# Patient Record
Sex: Male | Born: 1960 | Race: White | Hispanic: No | Marital: Married | Smoking: Current every day smoker
Health system: Southern US, Community
[De-identification: ages and names within clinical notes are randomized; demographics above are authoritative.]

## PROBLEM LIST (undated history)

## (undated) DIAGNOSIS — I10 Essential (primary) hypertension: Secondary | ICD-10-CM

## (undated) DIAGNOSIS — E119 Type 2 diabetes mellitus without complications: Secondary | ICD-10-CM

## (undated) DIAGNOSIS — F419 Anxiety disorder, unspecified: Secondary | ICD-10-CM

## (undated) DIAGNOSIS — Z9889 Other specified postprocedural states: Secondary | ICD-10-CM

## (undated) DIAGNOSIS — M1990 Unspecified osteoarthritis, unspecified site: Secondary | ICD-10-CM

## (undated) DIAGNOSIS — E785 Hyperlipidemia, unspecified: Secondary | ICD-10-CM

## (undated) DIAGNOSIS — I8290 Acute embolism and thrombosis of unspecified vein: Secondary | ICD-10-CM

## (undated) DIAGNOSIS — R112 Nausea with vomiting, unspecified: Secondary | ICD-10-CM

## (undated) DIAGNOSIS — I724 Aneurysm of artery of lower extremity: Secondary | ICD-10-CM

## (undated) HISTORY — DX: Acute embolism and thrombosis of unspecified vein: I82.90

## (undated) HISTORY — PX: INNER EAR SURGERY: SHX679

## (undated) HISTORY — DX: Type 2 diabetes mellitus without complications: E11.9

---

## 1994-12-10 HISTORY — PX: KNEE SURGERY: SHX244

## 2005-08-03 ENCOUNTER — Ambulatory Visit: Payer: Self-pay | Admitting: Internal Medicine

## 2006-05-14 ENCOUNTER — Ambulatory Visit: Payer: Self-pay | Admitting: Unknown Physician Specialty

## 2007-10-14 ENCOUNTER — Ambulatory Visit: Payer: Self-pay | Admitting: Unknown Physician Specialty

## 2007-10-14 ENCOUNTER — Other Ambulatory Visit: Payer: Self-pay

## 2007-10-21 ENCOUNTER — Ambulatory Visit: Payer: Self-pay | Admitting: Unknown Physician Specialty

## 2009-05-19 ENCOUNTER — Ambulatory Visit: Payer: Self-pay | Admitting: Internal Medicine

## 2014-10-13 ENCOUNTER — Emergency Department (HOSPITAL_COMMUNITY): Payer: 59

## 2014-10-13 ENCOUNTER — Encounter (HOSPITAL_COMMUNITY): Payer: Self-pay | Admitting: *Deleted

## 2014-10-13 ENCOUNTER — Emergency Department (HOSPITAL_COMMUNITY)
Admission: EM | Admit: 2014-10-13 | Discharge: 2014-10-13 | Disposition: A | Discharge status: Home / Self Care | Payer: 59 | Attending: Emergency Medicine | Admitting: Emergency Medicine

## 2014-10-13 DIAGNOSIS — Z720 Tobacco use: Secondary | ICD-10-CM | POA: Diagnosis not present

## 2014-10-13 DIAGNOSIS — M25461 Effusion, right knee: Secondary | ICD-10-CM | POA: Insufficient documentation

## 2014-10-13 DIAGNOSIS — I10 Essential (primary) hypertension: Secondary | ICD-10-CM | POA: Diagnosis not present

## 2014-10-13 DIAGNOSIS — R609 Edema, unspecified: Secondary | ICD-10-CM

## 2014-10-13 DIAGNOSIS — M25561 Pain in right knee: Secondary | ICD-10-CM | POA: Diagnosis present

## 2014-10-13 HISTORY — DX: Essential (primary) hypertension: I10

## 2014-10-13 MED ORDER — HYDROCODONE-ACETAMINOPHEN 5-325 MG PO TABS: 1.0000 | ORAL_TABLET | ORAL | Status: DC | PRN

## 2014-10-13 MED ORDER — NAPROXEN 250 MG PO TABS
500.0000 mg | ORAL_TABLET | Freq: Once | ORAL | Status: AC
  Administered 2014-10-13: 500 mg via ORAL
  Filled 2014-10-13: qty 2

## 2014-10-13 MED ORDER — NAPROXEN 500 MG PO TABS: 500.0000 mg | ORAL_TABLET | Freq: Two times a day (BID) | ORAL | Status: DC

## 2014-10-13 NOTE — Discharge Instructions (Signed)
Please follow the directions provided. Be sure to call the Ortho referral to establish care for your knee effusion. Please take Naprosyn twice a day to help with inflammation and pain. You may take Vicodin for pain not relieved by the Naprosyn. Don't hesitate to return for any new, worsening, or concerning symptoms.   SEEK MEDICAL CARE IF:  There is increased swelling in your knee.  You notice redness, swelling, or increasing pain in your knee.  An unexplained oral temperature above 102 F (38.9 C) develops. SEEK IMMEDIATE MEDICAL CARE IF:  You develop a rash.  You have difficulty breathing.  You have any allergic reactions from medications you may have been given.  There is severe pain with any motion of the knee.

## 2014-10-13 NOTE — ED Notes (Signed)
Pt reports hx of knee surgery in past and having pain and swelling to right knee since yesterday. Ambulatory at triage.

## 2014-10-13 NOTE — ED Provider Notes (Signed)
CSN: [id number]     Arrival date & time 10/13/14  0917 History  This chart was scribed for non-physician practitioner, Harle BattiestElizabeth Ethal Gotay, PA-C,working with Purvis SheffieldForrest Harrison, MD, by Karle PlumberJennifer Tensley, ED Scribe. This patient was seen in room TR06C/TR06C and the patient's care was started at 10:18 AM.  Chief Complaint  Patient presents with  . Knee Pain   Patient is a 53 y.o. male presenting with knee pain. The history is provided by the patient. No language interpreter was used.  Knee Pain Associated symptoms: no fever     HPI Comments:  Estell HarpinKeith A Ormond is a 53 y.o. male with PMH of HTN and DM who presents to the Emergency Department complaining of severe throbbing right knee pain that began two days ago while sleeping. He states the pain woke him from his sleep. He reports the pain radiates down to his foot. Bearing weight makes the pain worse. He has been wearing an old knee brace he has had for a while with minimal relief of the pain. Denies any trauma, fall or injury. Denies fever, chills, numbness, tingling or weakness of the right leg, red streaking or warmth. Reports h/o right knee surgery approximately 18 years ago. He reports that he no longer has an orthopedist.   Past Medical History  Diagnosis Date  . Hypertension    Past Surgical History  Procedure Laterality Date  . Knee surgery     History reviewed. No pertinent family history. History  Substance Use Topics  . Smoking status: Current Every Day Smoker    Types: Cigarettes  . Smokeless tobacco: Not on file  . Alcohol Use: No    Review of Systems  Constitutional: Negative for fever and chills.  Gastrointestinal: Negative for nausea.  Musculoskeletal: Positive for joint swelling and arthralgias.  Skin: Negative for rash.    Allergies  Review of patient's allergies indicates no known allergies.  Home Medications   Prior to Admission medications   Not on File   Triage Vitals: BP 140/85 mmHg  Pulse 88  Temp(Src)  97.7 F (36.5 C) (Oral)  Ht 5\' 9"  (1.753 m)  Wt 210 lb (95.255 kg)  BMI 31.00 kg/m2  SpO2 96% Physical Exam  Constitutional: He is oriented to person, place, and time. He appears well-developed and well-nourished.  HENT:  Head: Normocephalic and atraumatic.  Eyes: EOM are normal.  Neck: Normal range of motion.  Cardiovascular: Normal rate.   Pulmonary/Chest: Effort normal.  Musculoskeletal: Normal range of motion. He exhibits edema and tenderness.  Unilateral swelling around right patella, supra and medial patella more significant. ROM limited due to pain.  Neurological: He is alert and oriented to person, place, and time.  Skin: Skin is warm and dry.  Psychiatric: He has a normal mood and affect. His behavior is normal.  Nursing note and vitals reviewed.   ED Course  Procedures (including critical care time) DIAGNOSTIC STUDIES: Oxygen Saturation is 96% on RA, adequate by my interpretation.   COORDINATION OF CARE: 10:24 AM- Will prescribe pain medication and NSAID. Will give orthopedist referral. Pt verbalizes understanding and agrees to plan.  Medications  naproxen (NAPROSYN) tablet 500 mg (500 mg Oral Given 10/13/14 1115)    Labs Review Labs Reviewed - No data to display  Imaging Review Dg Knee Complete 4 Views Right  10/13/2014   CLINICAL DATA:  53 year old male with acute medial knee pain and swelling. No known injury. Initial encounter.  EXAM: RIGHT KNEE - COMPLETE 4+ VIEW  COMPARISON:  None.  FINDINGS: Moderate to large suprapatellar joint effusion. Advanced tricompartmental degenerative spurring. Joint space loss most pronounced in the medial compartment with subchondral sclerosis. Patella intact. No acute fracture or dislocation identified.  IMPRESSION: Advanced degenerative changes, medial compartment dominant. Joint effusion. No acute osseous abnormality identified.   Electronically Signed   By: Augusto GambleLee  Hall M.D.   On: 10/13/2014 11:04     EKG Interpretation None       MDM   Final diagnoses:  Swelling  Knee effusion, right   53 yo male presenting with knee pain and swelling without history of injury. His x-ray is negative for obvious fracture or dislocation, but shows joint effusion. He is afebrile, without signs or symptoms of joint infection. Pain managed in ED. Knee sleeve provided and orthopedic referral provided for drainage of effusion. Conservative therapy recommended and discussed. Patient will be dc home & is agreeable with above plan. Return precautions provided.  I personally performed the services described in this documentation, which was scribed in my presence. The recorded information has been reviewed and is accurate.  Filed Vitals:   10/13/14 0926 10/13/14 1216  BP: 140/85 152/96  Pulse: 88 74  Temp: 97.7 F (36.5 C) 98.6 F (37 C)  TempSrc: Oral Oral  Resp:  18  Height: 5\' 9"  (1.753 m)   Weight: 210 lb (95.255 kg)   SpO2: 96% 95%   Meds given in ED:  Medications  naproxen (NAPROSYN) tablet 500 mg (500 mg Oral Given 10/13/14 1115)    Discharge Medication List as of 10/13/2014 12:15 PM    START taking these medications   Details  HYDROcodone-acetaminophen (NORCO/VICODIN) 5-325 MG per tablet Take 1-2 tablets by mouth every 4 (four) hours as needed for moderate pain or severe pain., Starting 10/13/2014, Until Discontinued, Print    naproxen (NAPROSYN) 500 MG tablet Take 1 tablet (500 mg total) by mouth 2 (two) times daily with a meal., Starting 10/13/2014, Until Discontinued, Print         Harle BattiestElizabeth Sabrin Dunlevy, NP 10/15/14 1659  Purvis SheffieldForrest Harrison, MD 10/16/14 1017

## 2014-10-13 NOTE — ED Notes (Signed)
Pts vital signs updated pt awaiting discharge paperwork at bedside.  

## 2014-10-13 NOTE — ED Notes (Signed)
Applied knee sleeve. Pt verbalized understanding and demonstrated application of knee sleeve

## 2015-09-23 ENCOUNTER — Other Ambulatory Visit: Payer: Self-pay | Admitting: Unknown Physician Specialty

## 2015-09-23 ENCOUNTER — Ambulatory Visit
Admission: RE | Admit: 2015-09-23 | Discharge: 2015-09-23 | Disposition: A | Discharge status: Home / Self Care | Payer: 59 | Source: Ambulatory Visit | Attending: Unknown Physician Specialty | Admitting: Unknown Physician Specialty

## 2015-09-23 DIAGNOSIS — M25562 Pain in left knee: Secondary | ICD-10-CM

## 2015-09-23 DIAGNOSIS — I743 Embolism and thrombosis of arteries of the lower extremities: Secondary | ICD-10-CM | POA: Diagnosis not present

## 2015-09-23 DIAGNOSIS — I724 Aneurysm of artery of lower extremity: Secondary | ICD-10-CM | POA: Diagnosis not present

## 2015-09-28 ENCOUNTER — Emergency Department (HOSPITAL_COMMUNITY)
Admission: EM | Admit: 2015-09-28 | Discharge: 2015-09-28 | Disposition: A | Discharge status: Home / Self Care | Payer: 59 | Attending: Emergency Medicine | Admitting: Emergency Medicine

## 2015-09-28 ENCOUNTER — Encounter (HOSPITAL_COMMUNITY): Payer: Self-pay

## 2015-09-28 DIAGNOSIS — I724 Aneurysm of artery of lower extremity: Secondary | ICD-10-CM | POA: Diagnosis not present

## 2015-09-28 DIAGNOSIS — I10 Essential (primary) hypertension: Secondary | ICD-10-CM | POA: Diagnosis not present

## 2015-09-28 DIAGNOSIS — Z720 Tobacco use: Secondary | ICD-10-CM | POA: Diagnosis not present

## 2015-09-28 DIAGNOSIS — M25562 Pain in left knee: Secondary | ICD-10-CM | POA: Insufficient documentation

## 2015-09-28 MED ORDER — HYDROCODONE-ACETAMINOPHEN 5-325 MG PO TABS: 1.0000 | ORAL_TABLET | Freq: Four times a day (QID) | ORAL | Status: DC | PRN

## 2015-09-28 NOTE — ED Provider Notes (Signed)
CSN: [id number]     Arrival date & time 09/28/15  1259 History   First MD Initiated Contact with Patient 09/28/15 1315     Chief Complaint  Patient presents with  . Leg Pain     (Consider location/radiation/quality/duration/timing/severity/associated sxs/prior Treatment) HPI Comments: Estell HarpinKeith A Skibinski is a 54 y.o. male with a PMHx of HTN, who presents to the ED with complaints of ongoing left posterior knee pain 5 days. 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 Imogene Burnhen with vascular surgery next Wednesday. He states that he was out of work for the last 5 days, went back to work today and had pain when he was standing and walking around, therefore his boss prompted him to seek medical attention in the ER to see if there is any thing else that needed to be done before his appointment with Dr. Imogene Burnhen next week.   He reports the pain is 10/10 with walking but 0/10 at rest, located in the posterior aspect of his left knee, radiating to the posterior calf, intermittent with walking, burning in nature, worse with walking, relieved with rest, and unchanged with etodolac. He reports that he gets mild swelling in the leg when he stands or walks, improves with elevation, and is currently resolved.  He denies any fevers, chills, chest pain, shortness breath, abdominal pain, nausea, vomiting, diarrhea, constipation, dysuria, hematuria, numbness, tingling, or focal weakness.  Patient is a 54 y.o. male presenting with leg pain. The history is provided by the patient and medical records. No language interpreter was used.  Leg Pain Location:  Knee Time since incident:  5 days Injury: no   Knee location:  L knee Pain details:    Quality:  Burning   Radiates to:  L leg   Severity:  Severe   Onset quality:  Gradual   Duration:  5 days   Timing:  Intermittent   Progression:  Waxing and waning Chronicity:   New Prior injury to area:  No Relieved by:  Rest Worsened by:  Activity Ineffective treatments:  Arthritis medication Associated symptoms: swelling (with standing, improves with elevation, resolved at this time)   Associated symptoms: no decreased ROM, no fever, no muscle weakness, no numbness and no tingling     Past Medical History  Diagnosis Date  . Hypertension    Past Surgical History  Procedure Laterality Date  . Knee surgery     No family history on file. Social History  Substance Use Topics  . Smoking status: Current Every Day Smoker -- 0.50 packs/day    Types: Cigarettes  . Smokeless tobacco: None  . Alcohol Use: No    Review of Systems  Constitutional: Negative for fever and chills.  Respiratory: Negative for shortness of breath.   Cardiovascular: Positive for leg swelling (intermittently with walking/standing, improves with elevation, currently resolved). Negative for chest pain.  Gastrointestinal: Negative for nausea, vomiting, abdominal pain, diarrhea and constipation.  Genitourinary: Negative for dysuria and hematuria.  Musculoskeletal: Positive for arthralgias (L knee posteriorly). Negative for myalgias.  Skin: Negative for color change.  Allergic/Immunologic: Negative for immunocompromised state.  Neurological: Negative for weakness and numbness.  Psychiatric/Behavioral: Negative for confusion.   10 Systems reviewed and are negative for acute change except as noted in the HPI.    Allergies  Review of patient's allergies indicates no known allergies.  Home Medications   Prior to  Admission medications   Medication Sig Start Date End Date Taking? Authorizing Provider  HYDROcodone-acetaminophen (NORCO/VICODIN) 5-325 MG per tablet Take 1-2 tablets by mouth every 4 (four) hours as needed for moderate pain or severe pain. 10/13/14   Harle Battiest, NP  naproxen (NAPROSYN) 500 MG tablet Take 1 tablet (500 mg total) by mouth 2 (two) times daily with a meal.  10/13/14   Harle Battiest, NP   BP 141/89 mmHg  Pulse 78  Temp(Src) 98.2 F (36.8 C) (Oral)  Resp 14  Ht  (1.753 m)  Wt 232 lb (105.235 kg)  BMI 34.24 kg/m2  SpO2 96% Physical Exam  Constitutional: He is oriented to person, place, and time. Vital signs are normal. He appears well-developed and well-nourished.  Non-toxic appearance. No distress.  Afebrile, nontoxic, NAD  HENT:  Head: Normocephalic and atraumatic.  Mouth/Throat: Oropharynx is clear and moist and mucous membranes are normal.  Eyes: Conjunctivae and EOM are normal. Right eye exhibits no discharge. Left eye exhibits no discharge.  Neck: Normal range of motion. Neck supple.  Cardiovascular: Normal rate, regular rhythm, normal heart sounds and intact distal pulses.  Exam reveals no gallop and no friction rub.   No murmur heard. Pulmonary/Chest: Effort normal and breath sounds normal. No respiratory distress. He has no decreased breath sounds. He has no wheezes. He has no rhonchi. He has no rales.  Abdominal: Soft. Normal appearance and bowel sounds are normal. He exhibits no distension. There is no tenderness. There is no rigidity, no rebound, no guarding, no CVA tenderness, no tenderness at McBurney's point and negative Murphy's sign.  Musculoskeletal: Normal range of motion.       Left knee: He exhibits normal range of motion, no swelling, no effusion, no erythema, normal alignment, no LCL laxity, normal patellar mobility and no MCL laxity. No tenderness found.  MAE x4 Strength and sensation grossly intact Distal pulses intact in all extremities L knee with FROM intact, no joint line or bony TTP, no swelling/effusion/deformity, no bruising or erythema/warmth, no abnormal alignment or patellar mobility, no varus/valgus laxity, neg anterior drawer test, no crepitus. No focal areas of tenderness, no baker's cyst. No palpable masses posteriorly.  No pedal edema, neg homan's bilaterally   Neurological: He is alert and  oriented to person, place, and time. He has normal strength. No sensory deficit.  Skin: Skin is warm, dry and intact. No rash noted.  Psychiatric: He has a normal mood and affect.  Nursing note and vitals reviewed.   ED Course  Procedures (including critical care time) Labs Review Labs Reviewed - No data to display  Imaging Review No results found.    Study Result: LLE venous doppler u/s    CLINICAL DATA: Left posterior knee pain for 1 day.  EXAM: LEFT LOWER EXTREMITY VENOUS DOPPLER ULTRASOUND  TECHNIQUE: Gray-scale sonography with graded compression, as well as color Doppler and duplex ultrasound, were performed to evaluate the deep venous system from the level of the common femoral vein through the popliteal and proximal calf veins. Spectral Doppler was utilized to evaluate flow at rest and with distal augmentation maneuvers.  COMPARISON: None.  FINDINGS: Normal compressibility, augmentation and color Doppler flow in the left common femoral vein, left femoral vein and left popliteal vein. The left saphenofemoral junction is patent. Visualized left deep calf veins are patent.  The right common femoral vein is compressible without thrombus.  There is enlargement of the left popliteal artery measuring up to 2.1 cm in diameter. A small  amount of mural thrombus associated with this aneurysm. Aneurysm segment measures roughly 3.4 cm in length.  IMPRESSION: Negative for deep vein thrombosis in the left lower extremity.  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 Small amount of mural thrombus within the aneurysm. Recommend vascular surgery consultation for management of this aneurysm.   Electronically Signed  By: Richarda Overlie M.D.  On: 09/23/2015 17:19     I have personally reviewed and evaluated these images and lab results as part of my medical decision-making.   EKG Interpretation   Date/Time:  Wednesday September 28 2015 13:02:00  EDT Ventricular Rate:  81 PR Interval:  158 QRS Duration: 92 QT Interval:  353 QTC Calculation: 410 R Axis:   18 Text Interpretation:  Sinus rhythm Abnormal R-wave progression, early  transition Otherwise within normal limits No significant change since last  tracing Confirmed by POLLINA  MD, CHRISTOPHER [phone]) on 09/28/2015  1:09:25 PM      MDM   Final diagnoses:  Knee pain, acute, left  Popliteal artery aneurysm [facility])    54 y.o. 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 Imogene Burn in 1wk for this. Went back to work today and had worsening pain due to having to walk more, and his boss asked that he be seen in the ER. On exam, leg with no edema, no focal area of tenderness, no skin changes, distal pulses intact, leg NVI with soft compartments. Will call Dr. Imogene Burn to discuss if any further imaging could assist them for his upcoming appt, or if any other concerns should be addressed today. Will reassess shortly.   1:59 PM Spoke with Dr. Imogene Burn, he reports nothing further to do since pt has perfusion to foot. Will d/c home with additional pain medication, discussed elevation and rest to help with pain, and will give work note. F/up with Dr. Imogene Burn next wk at already scheduled appt. I explained the diagnosis and have given explicit precautions to return to the ER including for any other new or worsening symptoms. The patient understands and accepts the medical plan as it's been dictated and I have answered their questions. Discharge instructions concerning home care and prescriptions have been given. The patient is STABLE and is discharged to home in good condition.  BP 141/89 mmHg  Pulse 78  Temp(Src) 98.2 F (36.8 C) (Oral)  Resp 14  Ht  (1.753 m)  Wt 232 lb (105.235 kg)  BMI 34.24 kg/m2  SpO2 96%  Meds ordered this encounter  Medications  . HYDROcodone-acetaminophen (NORCO)  5-325 MG tablet    Sig: Take 1 tablet by mouth every 6 (six) hours as needed for severe pain.    Dispense:  15 tablet    Refill:  0    Order Specific Question:  Supervising Provider    Answer:  Eber Hong [[id number]]     Tina Gruner Camprubi-Soms, PA-C 09/28/15 1400  Gilda Crease, MD 09/28/15 1404

## 2015-09-28 NOTE — ED Notes (Addendum)
Pt came to ED c/o leg pain. Pt reports pain when leg is bent or when he walks. Pt sts it gets tight and swollen when walking. Pt reports going to the doctors on Friday and they told him he had an aneurysm in leg. Pt reports the doctors gave him arthritis medication. Pt has previous history of blood clot.

## 2015-09-28 NOTE — Discharge Instructions (Signed)
Use your home pain medication as directed as needed for pain, and use norco as directed as needed for additional pain relief but don't drive while taking norco. Keep your leg elevated and rest it to avoid aggravating your symptoms. Use heat to the area of soreness as needed for additional relief. Follow up with Dr. Imogene Burnhen next week for your already scheduled visit. Return to the ER for changes or worsening in symptoms.   Knee Pain Knee pain is a common problem. It can have many causes. The pain often goes away by following your doctor's home care instructions. Treatment for ongoing pain will depend on the cause of your pain. If your knee pain continues, more tests may be needed to diagnose your condition. Tests may include X-rays or other imaging studies of your knee. HOME CARE  Take medicines only as told by your doctor.  Rest your knee and keep it raised (elevated) while you are resting.  Do not do things that cause pain or make your pain worse.  Avoid activities where both feet leave the ground at the same time, such as running, jumping rope, or doing jumping jacks.  Apply ice to the knee area:  Put ice in a plastic bag.  Place a towel between your skin and the bag.  Leave the ice on for 20 minutes, 2-3 times a day.  Ask your doctor if you should wear an elastic knee support.  Sleep with a pillow under your knee.  Lose weight if you are overweight. Being overweight can make your knee hurt more.  Do not use any tobacco products, including cigarettes, chewing tobacco, or electronic cigarettes. If you need help quitting, ask your doctor. Smoking may slow the healing of any bone and joint problems that you may have. GET HELP IF:  Your knee pain does not stop, it changes, or it gets worse.  You have a fever along with knee pain.  Your knee gives out or locks up.  Your knee becomes more swollen. GET HELP RIGHT AWAY IF:   Your knee feels hot to the touch.  You have chest pain or  trouble breathing.   This information is not intended to replace advice given to you by your health care provider. Make sure you discuss any questions you have with your health care provider.   Document Released: 02/22/2009 Document Revised: 12/17/2014 Document Reviewed: 01/27/2014 Elsevier Interactive Patient Education 2016 [employer]  [employer]Heat Therapy Heat therapy can help ease sore, stiff, injured, and tight muscles and joints. Heat relaxes your muscles, which may help ease your pain.  RISKS AND COMPLICATIONS If you have any of the following conditions, do not use heat therapy unless your health care provider has approved:  Poor circulation.  Healing wounds or scarred skin in the area being treated.  Diabetes, heart disease, or high blood pressure.  Not being able to feel (numbness) the area being treated.  Unusual swelling of the area being treated.  Active infections.  Blood clots.  Cancer.  Inability to communicate pain. This may include young children and people who have problems with their brain function (dementia).  Pregnancy. Heat therapy should only be used on old, pre-existing, or long-lasting (chronic) injuries. Do not use heat therapy on new injuries unless directed by your health care provider. HOW TO USE HEAT THERAPY There are several different kinds of heat therapy, including:  Moist heat pack.  Warm water bath.  Hot water bottle.  Electric heating pad.  Heated gel pack.  Heated wrap.  Electric heating pad. Use the heat therapy method suggested by your health care provider. Follow your health care provider's instructions on when and how to use heat therapy. GENERAL HEAT THERAPY RECOMMENDATIONS  Do not sleep while using heat therapy. Only use heat therapy while you are awake.  Your skin may turn pink while using heat therapy. Do not use heat therapy if your skin turns red.  Do not use heat therapy if you have new pain.  High heat or long  exposure to heat can cause burns. Be careful when using heat therapy to avoid burning your skin.  Do not use heat therapy on areas of your skin that are already irritated, such as with a rash or sunburn. SEEK MEDICAL CARE IF:  You have blisters, redness, swelling, or numbness.  You have new pain.  Your pain is worse. MAKE SURE YOU:  Understand these instructions.  Will watch your condition.  Will get help right away if you are not doing well or get worse.   This information is not intended to replace advice given to you by your health care provider. Make sure you discuss any questions you have with your health care provider.   Document Released: 02/18/2012 Document Revised: 12/17/2014 Document Reviewed: 01/19/2014 Elsevier Interactive Patient Education [employer]

## 2015-09-30 ENCOUNTER — Encounter: Payer: Self-pay | Admitting: Vascular Surgery

## 2015-10-05 ENCOUNTER — Encounter: Payer: Self-pay | Admitting: *Deleted

## 2015-10-05 ENCOUNTER — Encounter: Payer: Self-pay | Admitting: Vascular Surgery

## 2015-10-05 ENCOUNTER — Ambulatory Visit (INDEPENDENT_AMBULATORY_CARE_PROVIDER_SITE_OTHER): Payer: 59 | Admitting: Vascular Surgery

## 2015-10-05 VITALS — BP 125/85 | HR 84 | Temp 97.8°F | Resp 16 | Ht 69.0 in | Wt 228.0 lb

## 2015-10-05 DIAGNOSIS — M25562 Pain in left knee: Secondary | ICD-10-CM

## 2015-10-05 DIAGNOSIS — I724 Aneurysm of artery of lower extremity: Secondary | ICD-10-CM | POA: Insufficient documentation

## 2015-10-05 NOTE — Progress Notes (Signed)
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 (03-20-1961) male who presents with chief complaint: L posterior knee pain.  Patient had sudden onset of L posterior knee pain and swelling ~3 weeks ago.  He was evaluated in an urgent clinic where work up included a venous duplex.  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  The patient denies any distal embolic sx: no toe pain, or change in toe color.  His sx are aching to sharp pain posterior to the left knee, without radiation, moderate to severe in intensity.  No obvious trigger other than ambulation..   Past Medical History  Diagnosis Date  . Hypertension   . Diabetes mellitus without complication (HCC)   . Blood clot in vein     left leg; Tx Oral medication    Past Surgical History  Procedure Laterality Date  . Knee surgery Right 1996  . Inner ear surgery      Social History   Social History  . Marital Status: Married    Spouse Name: N/A  . Number of Children: N/A  . Years of Education: N/A   Occupational History  . Not on file.   Social History Main Topics  . Smoking status: Light Tobacco Smoker -- 0.50 packs/day    Types: Cigarettes  . Smokeless tobacco: Never Used  . Alcohol Use: No  . Drug Use: No  . Sexual Activity: Not on file   Other Topics Concern  . Not on file   Social History Narrative    Family History: Mother has DM, HTN,HLD   Current Outpatient Prescriptions  Medication Sig Dispense Refill  . amLODipine (NORVASC) 2.5 MG tablet Take 10 mg by mouth daily.     Marland Kitchen aspirin 81 MG tablet Take 81 mg by mouth daily.    Marland Kitchen atorvastatin (LIPITOR) 40 MG tablet Take 20 mg by mouth daily.     Marland Kitchen etodolac (LODINE) 500 MG tablet Take 500 mg by mouth 2 (two) times daily.    Marland Kitchen LORazepam (ATIVAN) 1 MG tablet PRN  3  . metFORMIN  (GLUCOPHAGE) 500 MG tablet Take 500 mg by mouth daily.    . quinapril (ACCUPRIL) 40 MG tablet Take 40 mg by mouth daily.    Marland Kitchen amLODipine (NORVASC) 10 MG tablet     . aspirin 325 MG tablet Take 325 mg by mouth daily.    Marland Kitchen atorvastatin (LIPITOR) 20 MG tablet     . HYDROcodone-acetaminophen (NORCO) 5-325 MG tablet Take 1 tablet by mouth every 6 (six) hours as needed for severe pain. (Patient not taking: Reported on 10/05/2015) 15 tablet 0   No current facility-administered medications for this visit.     No Known Allergies   REVIEW OF SYSTEMS:  (Positives checked otherwise negative)  CARDIOVASCULAR:    chest pain,   chest pressure,   palpitations,   shortness of breath when laying flat,   shortness of breath with exertion,    pain in feet when walking,   pain in feet when laying flat,  history of blood clot in veins (DVT),   history of phlebitis,   swelling in legs,   varicose veins  PULMONARY:    productive cough,   asthma,    wheezing  NEUROLOGIC:   [x]  weakness in arms or legs,  [ ]  numbness in arms or legs,  [ ]  difficulty speaking or slurred speech,  [ ]  temporary loss of vision in one eye,  [ ]  dizziness  HEMATOLOGIC:   [ ]  bleeding problems,  [ ]  problems with blood clotting too easily  MUSCULOSKEL:   [ ]  joint pain, [ ]  joint swelling  GASTROINTEST:   [ ]  vomiting blood,  [ ]  blood in stool     GENITOURINARY:   [ ]  burning with urination,  [ ]  blood in urine  PSYCHIATRIC:   [ ]  history of major depression  INTEGUMENTARY:   [ ]  rashes,  [ ]  ulcers  CONSTITUTIONAL:   [ ]  fever,  [ ]  chills   For VQI Use Only  PRE-ADM LIVING: Home  AMB STATUS: Ambulatory  CAD Sx: None  PRIOR CHF: None  STRESS TEST: [x ] No, [ ]  Normal, [ ]  + ischemia, [ ]  + MI, [ ]  Both   Physical Examination  Filed Vitals:   10/05/15 1525  BP: 125/85  Pulse: 84  Temp: 97.8 F (36.6 C)  TempSrc: Oral  Resp: 16    Height: 5\' 9"  (1.753 m)  Weight: 228 lb (103.42 kg)  SpO2: 96%   Body mass index is 33.65 kg/(m^2).  General: A&O x 3, WDWN  Head: Douglas Anthony/AT  Ear/Nose/Throat: Hearing grossly intact, nares w/o erythema or drainage, oropharynx w/o Erythema/Exudate, Mallampati score: 3  Eyes: PERRLA, EOMI  Neck: Supple, no nuchal rigidity, no palpable LAD  Pulmonary: Sym exp, good air movt, CTAB, no rales, rhonchi, & wheezing  Cardiac: RRR, Nl S1, S2, no Murmurs, rubs or gallops  Vascular: Vessel Right Left  Radial Palpable Palpable  Brachial Palpable Palpable  Carotid Palpable, without bruit Palpable, without bruit  Aorta Not palpable N/A  Femoral Palpable Palpable  Popliteal Palpable Not palpable  PT Not Palpable Not Palpable  DP Palpable Palpable   Gastrointestinal: soft, NTND, no G/R, no HSM, no masses, no CVAT B, aorta not palpable  Musculoskeletal: M/S 5/5 throughout , Extremities without ischemic changes , well perfused toes with any thromboembolic skin changes  Neurologic: CN 2-12 intact , Pain and light touch intact in extremities , Motor exam as listed above  Psychiatric: Judgment intact, Mood & affect appropriate for pt's clinical situation  Dermatologic: See M/S exam for extremity exam, no rashes otherwise noted  Lymph : No Cervical, Axillary, or Inguinal lymphadenopathy   LLE Venous duplex (09/23/15) Negative for deep vein thrombosis in the left lower extremity.  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 Small amount of mural thrombus within the aneurysm. Recommend vascular surgery consultation for management of this aneurysm.   Outside Studies/Documentation 5 pages of outside documents were reviewed including: outpatient PCP chart.   Medical Decision Making  Douglas Anthony is a 54 y.o. male who presents with: L popliteal aneurysm, L knee pain   Pt's diagnosis was based on a venous study.  I would get: BLE ABI, BLE arterial duplex, and aortic  duplex.  There is an association between popliteal aneurysm and aortic aneurysm.    I can feel easily a R popliteal artery pulse but not a L popliteal artery pulse.  This implies that the L popliteal artery isn't that big.  Subsequently, I doubt this patient's L knee pain is due to his popliteal aneurysm.  Popliteal aneurysm present with thromboembolic sx.  A large size is necessary to get  any compressive sx on the vein and nerve.  Also, the history of sudden onset left leg swelling also does not correspond to the findings.  I would start with the non-invasive studies and then consider angiography vs CTA LLE to evaluate the position of the aneurysm.  I discussed in depth with the patient the nature of atherosclerosis, and emphasized the importance of maximal medical management including strict control of blood pressure, blood glucose, and lipid levels, antiplatelet agents, obtaining regular exercise, and cessation of smoking.    The patient is aware that without maximal medical management the underlying atherosclerotic disease process will progress, limiting the benefit of any interventions.  The patient will follow up in 1-3 weeks with the above studies.  Unfortunately, the end of year has resulted in a glut of people rushing to get last minutes studies prior to resetting the deducible for the year. The patient is currently on a statin: Lipitor. The patient is currently on an anti-platelet: ASA.  Thank you for allowing [place] to participate in this patient's care.   Leonides Sake, MD [facility] Office: [phone] Pager: [phone]  10/05/2015, 4:51 PM

## 2015-10-07 ENCOUNTER — Encounter: Payer: Self-pay | Admitting: Unknown Physician Specialty

## 2015-10-11 ENCOUNTER — Ambulatory Visit (HOSPITAL_COMMUNITY)
Admission: RE | Admit: 2015-10-11 | Discharge: 2015-10-11 | Disposition: A | Discharge status: Home / Self Care | Payer: 59 | Source: Ambulatory Visit | Attending: Vascular Surgery | Admitting: Vascular Surgery

## 2015-10-11 DIAGNOSIS — M25562 Pain in left knee: Secondary | ICD-10-CM | POA: Diagnosis not present

## 2015-10-11 DIAGNOSIS — I724 Aneurysm of artery of lower extremity: Secondary | ICD-10-CM | POA: Diagnosis not present

## 2015-10-25 ENCOUNTER — Encounter: Payer: Self-pay | Admitting: Vascular Surgery

## 2015-10-28 ENCOUNTER — Ambulatory Visit (HOSPITAL_COMMUNITY)
Admission: RE | Admit: 2015-10-28 | Discharge: 2015-10-28 | Disposition: A | Discharge status: Home / Self Care | Payer: 59 | Source: Ambulatory Visit | Attending: Vascular Surgery | Admitting: Vascular Surgery

## 2015-10-28 ENCOUNTER — Ambulatory Visit (INDEPENDENT_AMBULATORY_CARE_PROVIDER_SITE_OTHER): Payer: 59 | Admitting: Vascular Surgery

## 2015-10-28 ENCOUNTER — Other Ambulatory Visit: Payer: Self-pay

## 2015-10-28 ENCOUNTER — Ambulatory Visit (INDEPENDENT_AMBULATORY_CARE_PROVIDER_SITE_OTHER)
Admission: RE | Admit: 2015-10-28 | Discharge: 2015-10-28 | Disposition: A | Discharge status: Home / Self Care | Payer: 59 | Source: Ambulatory Visit | Attending: Vascular Surgery | Admitting: Vascular Surgery

## 2015-10-28 ENCOUNTER — Encounter: Payer: Self-pay | Admitting: Vascular Surgery

## 2015-10-28 VITALS — BP 126/89 | HR 79 | Temp 98.0°F | Resp 16 | Ht 69.0 in | Wt 230.0 lb

## 2015-10-28 DIAGNOSIS — I724 Aneurysm of artery of lower extremity: Secondary | ICD-10-CM | POA: Diagnosis present

## 2015-10-28 DIAGNOSIS — M25562 Pain in left knee: Secondary | ICD-10-CM | POA: Insufficient documentation

## 2015-10-28 NOTE — Progress Notes (Signed)
Established Intermittent Claudication  History of Present Illness  Douglas Anthony is a 54 y.o. (1961/12/03) male who presents with chief complaint: L>R posterior knee pain.  The patient's symptoms have progressed.  The patient's symptoms are: aching behind L>R knee extending into upper calf.  The patient's treatment regimen currently included: maximal medical management and rest.  Patient denies any thromboembolic sx.  The patient's PMH, PSH, and SH, and FamHx are unchanged from 10/05/15.  Current Outpatient Prescriptions  Medication Sig Dispense Refill  . amLODipine (NORVASC) 10 MG tablet     . amLODipine (NORVASC) 2.5 MG tablet Take 10 mg by mouth daily.     Marland Kitchen aspirin 325 MG tablet Take 325 mg by mouth daily.    Marland Kitchen aspirin 81 MG tablet Take 81 mg by mouth daily.    Marland Kitchen atorvastatin (LIPITOR) 20 MG tablet     . atorvastatin (LIPITOR) 40 MG tablet Take 20 mg by mouth daily.     Marland Kitchen etodolac (LODINE) 500 MG tablet Take 500 mg by mouth 2 (two) times daily.    Marland Kitchen HYDROcodone-acetaminophen (NORCO) 5-325 MG tablet Take 1 tablet by mouth every 6 (six) hours as needed for severe pain. 15 tablet 0  . LORazepam (ATIVAN) 1 MG tablet PRN  3  . metFORMIN (GLUCOPHAGE) 500 MG tablet Take 500 mg by mouth daily.    . quinapril (ACCUPRIL) 40 MG tablet Take 40 mg by mouth daily.     No current facility-administered medications for this visit.    No Known Allergies  On ROS today: no rest pain, no embolic sx.   Physical Examination  Filed Vitals:   10/28/15 1540 10/28/15 1546  BP: 132/91 126/89  Pulse: 78 79  Temp: 98 F (36.7 C)   TempSrc: Oral   Resp: 16   Height:  (1.753 m)   Weight: 230 lb (104.327 kg)   SpO2: 100%    Body mass index is 33.95 kg/(m^2).  General: A&O x 3, WDWN  Pulmonary: Sym exp, good air movt, CTAB, no rales, rhonchi, & wheezing  Cardiac: RRR, Nl S1, S2, no Murmurs, rubs or gallops  Vascular: Vessel Right Left  Radial Palpable Palpable  Brachial Palpable  Palpable  Carotid Palpable, without bruit Palpable, without bruit  Aorta Not palpable N/A  Femoral Palpable Palpable  Popliteal Not palpable Not palpable  PT Palpable Palpable  DP Palpable Palpable   Gastrointestinal: soft, NTND, no G/R, no HSM, no masses, no CVAT B  Musculoskeletal: M/S 5/5 throughout , Extremities without ischemic changes   Neurologic: Pain and light touch intact in extremities . Motor exam as listed above   Non-Invasive Vascular Imaging ABI (Date: 10/28/2015)  R:   ABI: 1.23,   DP: tri,   PT: tri,   TBI: 0.89  L:   ABI: 1.31,   DP: bi,   PT: tri,   TBI: 1.05  B popliteal duplex (Date: 10/28/2015)  R: 1.13 cm x 1.25 cm  L: 2.20 cm x 2.49 cm   Medical Decision Making  Douglas Anthony is a 54 y.o. male who presents with:  L large (>2.0 cm) popliteal aneurysm, L>R knee pain   Unclear to be if the patient is getting some nerve compression from the aneurysm.  I would not expect such given the size.  Regardless, the L popliteal aneurysm is big enough to require surgical intervention.    Based on the patient's vascular studies and examination, I have offered the patient: Abd/pelvis CTA with  BRo to evaluate the aneurysm position and runoff.  The patient will follow up with us after the imaging studies is completed to determine his surgical options.  BLE GSV mapping will also be needed if surgical reconstruction will be necessary.  Thank you for allowing us to participate in this patient's care.   Leonides SakeBrian Susanna Benge, MD [facility] [place] Office: [phone] Pager: [phone]  10/28/2015, 4:19 PM

## 2015-10-31 NOTE — Addendum Note (Signed)
Addended by: Adria DillELDRIDGE-LEWIS, Jennene Downie L on: 10/31/2015 10:00 AM   Modules accepted: Orders

## 2015-11-02 ENCOUNTER — Other Ambulatory Visit: Payer: Self-pay | Admitting: Vascular Surgery

## 2015-11-02 ENCOUNTER — Other Ambulatory Visit: Payer: Self-pay

## 2015-11-02 ENCOUNTER — Encounter (HOSPITAL_COMMUNITY): Payer: Self-pay

## 2015-11-02 ENCOUNTER — Other Ambulatory Visit: Payer: Self-pay | Admitting: *Deleted

## 2015-11-02 ENCOUNTER — Ambulatory Visit (HOSPITAL_COMMUNITY)
Admission: RE | Admit: 2015-11-02 | Discharge: 2015-11-02 | Disposition: A | Discharge status: Home / Self Care | Payer: 59 | Source: Ambulatory Visit | Attending: Vascular Surgery | Admitting: Vascular Surgery

## 2015-11-02 ENCOUNTER — Ambulatory Visit (INDEPENDENT_AMBULATORY_CARE_PROVIDER_SITE_OTHER)
Admission: RE | Admit: 2015-11-02 | Discharge: 2015-11-02 | Disposition: A | Discharge status: Home / Self Care | Payer: 59 | Source: Ambulatory Visit | Attending: Vascular Surgery | Admitting: Vascular Surgery

## 2015-11-02 DIAGNOSIS — M79605 Pain in left leg: Secondary | ICD-10-CM | POA: Diagnosis present

## 2015-11-02 DIAGNOSIS — I724 Aneurysm of artery of lower extremity: Secondary | ICD-10-CM | POA: Insufficient documentation

## 2015-11-02 DIAGNOSIS — M25462 Effusion, left knee: Secondary | ICD-10-CM | POA: Diagnosis not present

## 2015-11-02 DIAGNOSIS — Z01810 Encounter for preprocedural cardiovascular examination: Secondary | ICD-10-CM | POA: Diagnosis not present

## 2015-11-02 DIAGNOSIS — I739 Peripheral vascular disease, unspecified: Secondary | ICD-10-CM

## 2015-11-02 DIAGNOSIS — M25562 Pain in left knee: Secondary | ICD-10-CM

## 2015-11-02 DIAGNOSIS — M25461 Effusion, right knee: Secondary | ICD-10-CM | POA: Insufficient documentation

## 2015-11-02 DIAGNOSIS — Z01812 Encounter for preprocedural laboratory examination: Secondary | ICD-10-CM

## 2015-11-02 LAB — POCT I-STAT CREATININE: CREATININE: 0.9 mg/dL (ref 0.61–1.24)

## 2015-11-02 MED ORDER — IOHEXOL 350 MG/ML SOLN
100.0000 mL | Freq: Once | INTRAVENOUS | Status: AC | PRN
  Administered 2015-11-02: 100 mL via INTRAVENOUS

## 2015-11-07 ENCOUNTER — Encounter: Payer: Self-pay | Admitting: Vascular Surgery

## 2015-11-09 ENCOUNTER — Encounter: Payer: Self-pay | Admitting: Vascular Surgery

## 2015-11-09 ENCOUNTER — Other Ambulatory Visit: Payer: Self-pay

## 2015-11-09 ENCOUNTER — Ambulatory Visit (INDEPENDENT_AMBULATORY_CARE_PROVIDER_SITE_OTHER): Payer: 59 | Admitting: Vascular Surgery

## 2015-11-09 VITALS — BP 128/92 | HR 93 | Temp 97.6°F | Resp 16 | Ht 69.0 in | Wt 229.0 lb

## 2015-11-09 DIAGNOSIS — M25562 Pain in left knee: Secondary | ICD-10-CM | POA: Diagnosis not present

## 2015-11-09 DIAGNOSIS — I724 Aneurysm of artery of lower extremity: Secondary | ICD-10-CM

## 2015-11-09 NOTE — Progress Notes (Signed)
  Established Popliteal Aneurysm  History of Present Illness  Douglas Anthony is a 54 y.o. (05/30/1961) male who presents with chief complaint: left posterior calf/knee pain.  The patient's symptoms have not progressed.  The patient's symptoms are: aching pain behind knee extending into calf.  The patient's treatment regimen currently included: maximal medical management.  Pt returns after his CTA abd/pelvis with runoff.  Past Medical History  Diagnosis Date  . Hypertension   . Diabetes mellitus without complication (HCC)   . Blood clot in vein     left leg; Tx Oral medication    Past Surgical History  Procedure Laterality Date  . Knee surgery Right 1996  . Inner ear surgery      Social History   Social History  . Marital Status: Married    Spouse Name: N/A  . Number of Children: N/A  . Years of Education: N/A   Occupational History  . Not on file.   Social History Main Topics  . Smoking status: Light Tobacco Smoker -- 0.50 packs/day    Types: Cigarettes  . Smokeless tobacco: Never Used  . Alcohol Use: No  . Drug Use: No  . Sexual Activity: Not on file   Other Topics Concern  . Not on file   Social History Narrative   Family History: Mother has DM, HTN, HLD  Current Outpatient Prescriptions  Medication Sig Dispense Refill  . amLODipine (NORVASC) 10 MG tablet     . amLODipine (NORVASC) 2.5 MG tablet Take 10 mg by mouth daily.     . aspirin 81 MG tablet Take 81 mg by mouth daily.    . atorvastatin (LIPITOR) 40 MG tablet Take 20 mg by mouth daily.     . etodolac (LODINE) 500 MG tablet Take 500 mg by mouth 2 (two) times daily.    . LORazepam (ATIVAN) 1 MG tablet PRN  3  . metFORMIN (GLUCOPHAGE) 500 MG tablet Take 500 mg by mouth daily.    . quinapril (ACCUPRIL) 40 MG tablet Take 40 mg by mouth daily.    . aspirin 325 MG tablet Take 325 mg by mouth daily.    . atorvastatin (LIPITOR) 20 MG tablet     . HYDROcodone-acetaminophen (NORCO) 5-325 MG tablet Take 1  tablet by mouth every 6 (six) hours as needed for severe pain. (Patient not taking: Reported on 11/09/2015) 15 tablet 0   No current facility-administered medications for this visit.     No Known Allergies   REVIEW OF SYSTEMS:  (Positives checked otherwise negative)  CARDIOVASCULAR:   [ ] chest pain,  [ ] chest pressure,  [ ] palpitations,  [ ] shortness of breath when laying flat,  [ ] shortness of breath with exertion,   [ ] pain in feet when walking,  [ ] pain in feet when laying flat, [ ] history of blood clot in veins (DVT),  [ ] history of phlebitis,  [ ] swelling in legs,  [ ] varicose veins  PULMONARY:   [ ] productive cough,  [ ] asthma,  [ ] wheezing  NEUROLOGIC:   [ ] weakness in arms or legs,  [ ] numbness in arms or legs,  [ ] difficulty speaking or slurred speech,  [ ] temporary loss of vision in one eye,  [ ] dizziness  HEMATOLOGIC:   [ ] bleeding problems,  [ ] problems with blood clotting too easily  MUSCULOSKEL:   [x] joint pain, [ ] joint swelling    GASTROINTEST:   [ ]  vomiting blood,  [ ]  blood in stool     GENITOURINARY:   [ ]  burning with urination,  [ ]  blood in urine  PSYCHIATRIC:   [ ]  history of major depression  INTEGUMENTARY:   [ ]  rashes,  [ ]  ulcers  CONSTITUTIONAL:   [ ]  fever,  [ ]  chills     Physical Examination  Filed Vitals:   11/09/15 1249  BP: 128/92  Pulse: 93  Temp: 97.6 F (36.4 C)  Resp: 16  Height: 5\' 9"  (1.753 m)  Weight: 229 lb (103.874 kg)  SpO2: 96%   Body mass index is 33.8 kg/(m^2).  General: A&O x 3, WDWN  Pulmonary: Sym exp, good air movt, CTAB, no rales, rhonchi, & wheezing  Cardiac: RRR, Nl S1, S2, no Murmurs, rubs or gallops  Vascular: Vessel Right Left  Radial Palpable Palpable  Brachial Palpable Palpable  Carotid Palpable, without bruit Palpable, without bruit  Aorta Not palpable N/A  Femoral Palpable Palpable  Popliteal Not palpable Prominently palpable  PT Palpable  Palpable  DP Palpable Palpable   Gastrointestinal: soft, NTND, no G/R, no HSM, no masses, no CVAT B  Musculoskeletal: M/S 5/5 throughout , Extremities without ischemic changes   Neurologic: Pain and light touch intact in extremities , Motor exam as listed above  CTA abd/pelvis with runoff (11/02/15) 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 No popliteal artery occlusion.  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  No significant arterial occlusive disease in the abdomen, pelvis or lower extremities.  No acute abnormality in the abdomen or pelvis.  Degenerative changes in the knees with joint effusions.  Based on my review of this CTA, this patient has a large L popliteal artery aneurysm which intact runoff.  There is already thrombus in the aneurysm.  The location of the aneurysm makes it not amendable to stenting.  There is no evidence of AAA.  BLE GSV Mapping (11/02/15) Both vein good conduits  Medical Decision Making  Douglas Anthony is a 54 y.o. male who presents with:  Large L popliteal artery aneurysm, R popliteal artery ectasia   Based on the patient's vascular studies and examination, I have offered the patient: L popliteal artery aneurysm exclusion, L above-the-knee to below-the-knee popliteal bypass with L GSV.  He is scheduled for this coming 5 DEC 16.  The risk, benefits, and alternative for bypass operations were discussed with the patient.  The patient is aware the risks include but are not limited to: bleeding, infection, myocardial infarction, stroke, limb loss, nerve damage, need for additional procedures in the future, wound complications, and inability to complete the bypass.    The patient is aware of these risks and agreed to proceed.  Thank you for allowing us to participate in this patient's care.   Leonides SakeBrian Rielynn Trulson, MD [facility] [place] Office: [phone] Pager:  [phone]  11/09/2015, 1:32 PM

## 2015-11-11 ENCOUNTER — Encounter (HOSPITAL_COMMUNITY): Payer: Self-pay

## 2015-11-11 ENCOUNTER — Encounter (HOSPITAL_COMMUNITY)
Admission: RE | Admit: 2015-11-11 | Discharge: 2015-11-11 | Disposition: A | Discharge status: Home / Self Care | Payer: 59 | Source: Ambulatory Visit | Attending: Vascular Surgery | Admitting: Vascular Surgery

## 2015-11-11 ENCOUNTER — Other Ambulatory Visit: Payer: Self-pay | Admitting: *Deleted

## 2015-11-11 DIAGNOSIS — I724 Aneurysm of artery of lower extremity: Secondary | ICD-10-CM | POA: Insufficient documentation

## 2015-11-11 DIAGNOSIS — Z0183 Encounter for blood typing: Secondary | ICD-10-CM | POA: Diagnosis not present

## 2015-11-11 DIAGNOSIS — Z01812 Encounter for preprocedural laboratory examination: Secondary | ICD-10-CM | POA: Diagnosis not present

## 2015-11-11 HISTORY — DX: Other specified postprocedural states: R11.2

## 2015-11-11 HISTORY — DX: Hyperlipidemia, unspecified: E78.5

## 2015-11-11 HISTORY — DX: Unspecified osteoarthritis, unspecified site: M19.90

## 2015-11-11 HISTORY — DX: Other specified postprocedural states: Z98.890

## 2015-11-11 LAB — CBC
HEMATOCRIT: 48.2 % (ref 39.0–52.0)
Hemoglobin: 17.3 g/dL — ABNORMAL HIGH (ref 13.0–17.0)
MCH: 31.6 pg (ref 26.0–34.0)
MCHC: 35.9 g/dL (ref 30.0–36.0)
MCV: 88.1 fL (ref 78.0–100.0)
Platelets: 197 10*3/uL (ref 150–400)
RBC: 5.47 MIL/uL (ref 4.22–5.81)
RDW: 11.9 % (ref 11.5–15.5)
WBC: 6.9 10*3/uL (ref 4.0–10.5)

## 2015-11-11 LAB — COMPREHENSIVE METABOLIC PANEL
ALK PHOS: 62 U/L (ref 38–126)
ALT: 53 U/L (ref 17–63)
AST: 26 U/L (ref 15–41)
Albumin: 4.3 g/dL (ref 3.5–5.0)
Anion gap: 9 (ref 5–15)
BILIRUBIN TOTAL: 0.8 mg/dL (ref 0.3–1.2)
BUN: 9 mg/dL (ref 6–20)
CHLORIDE: 103 mmol/L (ref 101–111)
CO2: 23 mmol/L (ref 22–32)
CREATININE: 0.76 mg/dL (ref 0.61–1.24)
Calcium: 9.3 mg/dL (ref 8.9–10.3)
GFR calc Af Amer: >60 mL/min (ref >60)
Glucose, Bld: 205 mg/dL — ABNORMAL HIGH (ref 65–99)
Potassium: 3.8 mmol/L (ref 3.5–5.1)
Sodium: 135 mmol/L (ref 135–145)
TOTAL PROTEIN: 7.2 g/dL (ref 6.5–8.1)

## 2015-11-11 LAB — TYPE AND SCREEN
ABO/RH(D): B POS
ANTIBODY SCREEN: NEGATIVE

## 2015-11-11 LAB — URINALYSIS, ROUTINE W REFLEX MICROSCOPIC
BILIRUBIN URINE: NEGATIVE
KETONES UR: NEGATIVE mg/dL
LEUKOCYTES UA: NEGATIVE
NITRITE: NEGATIVE
PH: 5.5 (ref 5.0–8.0)
Protein, ur: 100 mg/dL — AB
SPECIFIC GRAVITY, URINE: 1.022 (ref 1.005–1.030)

## 2015-11-11 LAB — APTT: APTT: 35 s (ref 24–37)

## 2015-11-11 LAB — PROTIME-INR
INR: 1.04 (ref 0.00–1.49)
PROTHROMBIN TIME: 13.8 s (ref 11.6–15.2)

## 2015-11-11 LAB — URINE MICROSCOPIC-ADD ON

## 2015-11-11 LAB — SURGICAL PCR SCREEN
MRSA, PCR: NEGATIVE
STAPHYLOCOCCUS AUREUS: POSITIVE — AB

## 2015-11-11 LAB — ABO/RH: ABO/RH(D): B POS

## 2015-11-11 LAB — GLUCOSE, CAPILLARY: GLUCOSE-CAPILLARY: 205 mg/dL — AB (ref 65–99)

## 2015-11-11 NOTE — Progress Notes (Signed)
[facility] and then called and informed patient of positive PCR results. Patient verbalized understanding and informed Nurse that his surgery was changed from Monday to Wednesday.

## 2015-11-11 NOTE — Progress Notes (Signed)
PCP is Dewaine Oatsenny Tate  Patient denied having any acute cardiac or pulmonary issues  Patient informed Nurse that he does not check his blood sugar at home because he is a "borderline" diabetic. Patient was unaware of the result of his last A1C. CBG on arrival to PAT was 205. Patient stated he had not consumed any food, but drank four cups of coffee with sugar.

## 2015-11-11 NOTE — Pre-Procedure Instructions (Signed)
Estell HarpinKeith A Donalson  11/11/2015     Your procedure is scheduled on : Monday November 14, 2015 at 7:30 AM.  [facility] 5:30 AM.  Call this number if you have problems the morning of surgery: [phone]    Remember:  Do not eat food or drink liquids after midnight.  Take these medicines the morning of surgery with A SIP OF WATER : Amlodipine (Norvac), Lorazepam (Ativan) if needed    Stop taking any vitamins, Ibuprofen, Advil, Motrin, Aleve, Etodolac/Lodine, etc   Do NOT take any diabetic pills the morning of your surgery (NO metformin/Glucophage)  How to Manage Your Diabetes Before Surgery   Why is it important to control my blood sugar before and after surgery?   Improving blood sugar levels before and after surgery helps healing and can limit problems.  A way of improving blood sugar control is eating a healthy diet by:  - Eating less sugar and carbohydrates  - Increasing activity/exercise  - Talk with your doctor about reaching your blood sugar goals  High blood sugars (greater than 180 mg/dL) can raise your risk of infections and slow down your recovery so you will need to focus on controlling your diabetes during the weeks before surgery.  Make sure that the doctor who takes care of your diabetes knows about your planned surgery including the date and location.  How do I manage my blood sugars before surgery?   Check your blood sugar at least 4 times a day, 2 days before surgery to make sure that they are not too high or low.   Check your blood sugar the morning of your surgery when you wake up and every 2 hours until you get to the Short-Stay unit.  If your blood sugar is less than 70 mg/dL, you will need to treat for low blood sugar by:  Treat a low blood sugar (less than 70 mg/dL) with 1/2 cup of clear juice (cranberry or apple), 4 glucose tablets, OR glucose gel.  Recheck blood sugar in 15 minutes after treatment (to make sure it is  greater than 70 mg/dL).  If blood sugar is not greater than 70 mg/dL on re-check, call [phone] for further instructions.   Report your blood sugar to the Short-Stay nurse when you get to Short-Stay.  References:  [facility], 2007 "How to Manage your Diabetes Before and After Surgery".  What do I do about my diabetes medications?   Do not take oral diabetes medicines (pills) the morning of surgery.   Do not wear jewelry.  Do not wear lotions, powders, or cologne.    Men may shave face and neck.  Do not bring valuables to the hospital.  [facility] is not responsible for any belongings or valuables.  Contacts, dentures or bridgework may not be worn into surgery.  Leave your suitcase in the car.  After surgery it may be brought to your room.  For patients admitted to the hospital, discharge time will be determined by your treatment team.  Patients discharged the day of surgery will not be allowed to drive home.   Name and phone number of your driver:    Special instructions:  Shower using CHG soap the night before and the morning of your surgery  Please read over the following fact sheets that you were given. Pain Booklet, Coughing and Deep Breathing, Blood Transfusion Information, MRSA Information and Surgical Site Infection Prevention

## 2015-11-12 LAB — HEMOGLOBIN A1C
HEMOGLOBIN A1C: 8.1 % — AB (ref 4.8–5.6)
Mean Plasma Glucose: 186 mg/dL

## 2015-11-15 MED ORDER — DEXTROSE 5 % IV SOLN
1.5000 g | INTRAVENOUS | Status: AC
  Administered 2015-11-16: 1.5 g via INTRAVENOUS
  Filled 2015-11-15: qty 1.5

## 2015-11-15 MED ORDER — SODIUM CHLORIDE 0.9 % IV SOLN: INTRAVENOUS | Status: DC

## 2015-11-16 ENCOUNTER — Inpatient Hospital Stay (HOSPITAL_COMMUNITY)
Admission: RE | Admit: 2015-11-16 | Discharge: 2015-11-18 | DRG: 253 | Disposition: A | Discharge status: Home / Self Care | Payer: 59 | Source: Ambulatory Visit | Attending: Vascular Surgery | Admitting: Vascular Surgery

## 2015-11-16 ENCOUNTER — Encounter (HOSPITAL_COMMUNITY): Payer: Self-pay | Admitting: *Deleted

## 2015-11-16 ENCOUNTER — Inpatient Hospital Stay (HOSPITAL_COMMUNITY): Payer: 59 | Admitting: Certified Registered Nurse Anesthetist

## 2015-11-16 ENCOUNTER — Encounter (HOSPITAL_COMMUNITY)
Admission: RE | Disposition: A | Discharge status: Home / Self Care | Payer: Self-pay | Source: Ambulatory Visit | Attending: Vascular Surgery

## 2015-11-16 DIAGNOSIS — I724 Aneurysm of artery of lower extremity: Secondary | ICD-10-CM | POA: Diagnosis present

## 2015-11-16 DIAGNOSIS — I10 Essential (primary) hypertension: Secondary | ICD-10-CM | POA: Diagnosis present

## 2015-11-16 DIAGNOSIS — Z7982 Long term (current) use of aspirin: Secondary | ICD-10-CM | POA: Diagnosis not present

## 2015-11-16 DIAGNOSIS — M1990 Unspecified osteoarthritis, unspecified site: Secondary | ICD-10-CM | POA: Diagnosis present

## 2015-11-16 DIAGNOSIS — E1151 Type 2 diabetes mellitus with diabetic peripheral angiopathy without gangrene: Secondary | ICD-10-CM | POA: Diagnosis present

## 2015-11-16 DIAGNOSIS — Z720 Tobacco use: Secondary | ICD-10-CM

## 2015-11-16 DIAGNOSIS — Z7984 Long term (current) use of oral hypoglycemic drugs: Secondary | ICD-10-CM

## 2015-11-16 DIAGNOSIS — E785 Hyperlipidemia, unspecified: Secondary | ICD-10-CM | POA: Diagnosis present

## 2015-11-16 DIAGNOSIS — I743 Embolism and thrombosis of arteries of the lower extremities: Secondary | ICD-10-CM | POA: Diagnosis present

## 2015-11-16 DIAGNOSIS — E1165 Type 2 diabetes mellitus with hyperglycemia: Secondary | ICD-10-CM | POA: Diagnosis present

## 2015-11-16 HISTORY — PX: BYPASS GRAFT POPLITEAL TO POPLITEAL: SHX5763

## 2015-11-16 HISTORY — PX: VEIN HARVEST: SHX6363

## 2015-11-16 LAB — GLUCOSE, CAPILLARY
GLUCOSE-CAPILLARY: 198 mg/dL — AB (ref 65–99)
Glucose-Capillary: 209 mg/dL — ABNORMAL HIGH (ref 65–99)
Glucose-Capillary: 231 mg/dL — ABNORMAL HIGH (ref 65–99)

## 2015-11-16 SURGERY — CREATION, BYPASS, ARTERIAL, POPLITEAL
Anesthesia: General | Laterality: Left

## 2015-11-16 MED ORDER — CHLORHEXIDINE GLUCONATE CLOTH 2 % EX PADS: 6.0000 | MEDICATED_PAD | Freq: Once | CUTANEOUS | Status: DC

## 2015-11-16 MED ORDER — PNEUMOCOCCAL VAC POLYVALENT 25 MCG/0.5ML IJ INJ
0.5000 mL | INJECTION | INTRAMUSCULAR | Status: AC
  Administered 2015-11-17: 0.5 mL via INTRAMUSCULAR
  Filled 2015-11-16: qty 0.5

## 2015-11-16 MED ORDER — METFORMIN HCL 500 MG PO TABS: 500.0000 mg | ORAL_TABLET | Freq: Every day | ORAL | Status: DC

## 2015-11-16 MED ORDER — LACTATED RINGERS IV SOLN
INTRAVENOUS | Status: DC | PRN
  Administered 2015-11-16 (×2): via INTRAVENOUS

## 2015-11-16 MED ORDER — ONDANSETRON HCL 4 MG/2ML IJ SOLN: 4.0000 mg | Freq: Four times a day (QID) | INTRAMUSCULAR | Status: DC | PRN

## 2015-11-16 MED ORDER — QUINAPRIL HCL 10 MG PO TABS
40.0000 mg | ORAL_TABLET | Freq: Every day | ORAL | Status: DC
  Administered 2015-11-17 – 2015-11-18 (×2): 40 mg via ORAL
  Filled 2015-11-16 (×2): qty 4

## 2015-11-16 MED ORDER — SODIUM CHLORIDE 0.9 % IV SOLN
INTRAVENOUS | Status: DC
  Administered 2015-11-16: 22:00:00 via INTRAVENOUS

## 2015-11-16 MED ORDER — ACETAMINOPHEN 650 MG RE SUPP: 325.0000 mg | RECTAL | Status: DC | PRN

## 2015-11-16 MED ORDER — OXYCODONE HCL 5 MG PO TABS
5.0000 mg | ORAL_TABLET | ORAL | Status: DC | PRN
  Administered 2015-11-17: 5 mg via ORAL
  Filled 2015-11-16: qty 1

## 2015-11-16 MED ORDER — LIDOCAINE HCL (CARDIAC) 20 MG/ML IV SOLN
INTRAVENOUS | Status: DC | PRN
  Administered 2015-11-16: 100 mg via INTRAVENOUS

## 2015-11-16 MED ORDER — PROTAMINE SULFATE 10 MG/ML IV SOLN
INTRAVENOUS | Status: DC | PRN
  Administered 2015-11-16: 50 mg via INTRAVENOUS

## 2015-11-16 MED ORDER — ATORVASTATIN CALCIUM 20 MG PO TABS
20.0000 mg | ORAL_TABLET | Freq: Every day | ORAL | Status: DC
  Administered 2015-11-17 – 2015-11-18 (×2): 20 mg via ORAL
  Filled 2015-11-16 (×2): qty 1

## 2015-11-16 MED ORDER — ONDANSETRON HCL 4 MG/2ML IJ SOLN
INTRAMUSCULAR | Status: DC | PRN
  Administered 2015-11-16: 4 mg via INTRAVENOUS

## 2015-11-16 MED ORDER — 0.9 % SODIUM CHLORIDE (POUR BTL) OPTIME
TOPICAL | Status: DC | PRN
  Administered 2015-11-16: 2000 mL

## 2015-11-16 MED ORDER — ALBUTEROL SULFATE HFA 108 (90 BASE) MCG/ACT IN AERS
INHALATION_SPRAY | RESPIRATORY_TRACT | Status: AC
  Filled 2015-11-16: qty 6.7

## 2015-11-16 MED ORDER — SODIUM CHLORIDE 0.9 % IV SOLN: 500.0000 mL | Freq: Once | INTRAVENOUS | Status: DC | PRN

## 2015-11-16 MED ORDER — INSULIN ASPART 100 UNIT/ML ~~LOC~~ SOLN
0.0000 [IU] | Freq: Three times a day (TID) | SUBCUTANEOUS | Status: DC
  Administered 2015-11-17: 3 [IU] via SUBCUTANEOUS
  Administered 2015-11-17: 5 [IU] via SUBCUTANEOUS
  Administered 2015-11-17 – 2015-11-18 (×2): 2 [IU] via SUBCUTANEOUS

## 2015-11-16 MED ORDER — FENTANYL CITRATE (PF) 100 MCG/2ML IJ SOLN
INTRAMUSCULAR | Status: DC | PRN
  Administered 2015-11-16 (×2): 50 ug via INTRAVENOUS
  Administered 2015-11-16: 100 ug via INTRAVENOUS
  Administered 2015-11-16 (×2): 50 ug via INTRAVENOUS

## 2015-11-16 MED ORDER — ALUM & MAG HYDROXIDE-SIMETH 200-200-20 MG/5ML PO SUSP: 15.0000 mL | ORAL | Status: DC | PRN

## 2015-11-16 MED ORDER — LABETALOL HCL 5 MG/ML IV SOLN: 10.0000 mg | INTRAVENOUS | Status: DC | PRN

## 2015-11-16 MED ORDER — PANTOPRAZOLE SODIUM 40 MG PO TBEC
40.0000 mg | DELAYED_RELEASE_TABLET | Freq: Every day | ORAL | Status: DC
  Administered 2015-11-17 – 2015-11-18 (×2): 40 mg via ORAL
  Filled 2015-11-16 (×2): qty 1

## 2015-11-16 MED ORDER — HEMOSTATIC AGENTS (NO CHARGE) OPTIME
TOPICAL | Status: DC | PRN
  Administered 2015-11-16: 1 via TOPICAL

## 2015-11-16 MED ORDER — MIDAZOLAM HCL 10 MG/2ML IJ SOLN
INTRAMUSCULAR | Status: AC
  Filled 2015-11-16: qty 2

## 2015-11-16 MED ORDER — LORAZEPAM 1 MG PO TABS: 1.0000 mg | ORAL_TABLET | Freq: Three times a day (TID) | ORAL | Status: DC | PRN

## 2015-11-16 MED ORDER — SODIUM CHLORIDE 0.9 % IV SOLN
INTRAVENOUS | Status: DC | PRN
  Administered 2015-11-16: 09:00:00

## 2015-11-16 MED ORDER — MAGNESIUM SULFATE 2 GM/50ML IV SOLN
2.0000 g | Freq: Every day | INTRAVENOUS | Status: DC | PRN
  Filled 2015-11-16: qty 50

## 2015-11-16 MED ORDER — DEXTROSE 5 % IV SOLN
1.5000 g | Freq: Two times a day (BID) | INTRAVENOUS | Status: AC
  Administered 2015-11-16 – 2015-11-17 (×2): 1.5 g via INTRAVENOUS
  Filled 2015-11-16 (×3): qty 1.5

## 2015-11-16 MED ORDER — ENOXAPARIN SODIUM 40 MG/0.4ML ~~LOC~~ SOLN
40.0000 mg | SUBCUTANEOUS | Status: DC
Start: 2015-11-17 — End: 2015-11-18
  Administered 2015-11-17 – 2015-11-18 (×2): 40 mg via SUBCUTANEOUS
  Filled 2015-11-16 (×2): qty 0.4

## 2015-11-16 MED ORDER — GUAIFENESIN-DM 100-10 MG/5ML PO SYRP: 15.0000 mL | ORAL_SOLUTION | ORAL | Status: DC | PRN

## 2015-11-16 MED ORDER — FENTANYL CITRATE (PF) 250 MCG/5ML IJ SOLN
INTRAMUSCULAR | Status: AC
  Filled 2015-11-16: qty 5

## 2015-11-16 MED ORDER — SUCCINYLCHOLINE CHLORIDE 20 MG/ML IJ SOLN
INTRAMUSCULAR | Status: DC | PRN
  Administered 2015-11-16: 140 mg via INTRAVENOUS

## 2015-11-16 MED ORDER — ROCURONIUM BROMIDE 100 MG/10ML IV SOLN
INTRAVENOUS | Status: DC | PRN
  Administered 2015-11-16: 50 mg via INTRAVENOUS

## 2015-11-16 MED ORDER — PHENOL 1.4 % MT LIQD: 1.0000 | OROMUCOSAL | Status: DC | PRN

## 2015-11-16 MED ORDER — HEPARIN SODIUM (PORCINE) 1000 UNIT/ML IJ SOLN
INTRAMUSCULAR | Status: DC | PRN
  Administered 2015-11-16: 11000 [IU] via INTRAVENOUS
  Administered 2015-11-16 (×2): 2000 [IU] via INTRAVENOUS

## 2015-11-16 MED ORDER — PROPOFOL 10 MG/ML IV BOLUS
INTRAVENOUS | Status: AC
  Filled 2015-11-16: qty 20

## 2015-11-16 MED ORDER — PHENYLEPHRINE HCL 10 MG/ML IJ SOLN
INTRAMUSCULAR | Status: DC | PRN
  Administered 2015-11-16: 120 ug via INTRAVENOUS
  Administered 2015-11-16: 80 ug via INTRAVENOUS
  Administered 2015-11-16 (×2): 120 ug via INTRAVENOUS
  Administered 2015-11-16: 80 ug via INTRAVENOUS
  Administered 2015-11-16 (×2): 120 ug via INTRAVENOUS

## 2015-11-16 MED ORDER — PROPOFOL 10 MG/ML IV BOLUS
INTRAVENOUS | Status: DC | PRN
  Administered 2015-11-16: 200 mg via INTRAVENOUS

## 2015-11-16 MED ORDER — POTASSIUM CHLORIDE CRYS ER 20 MEQ PO TBCR: 20.0000 meq | EXTENDED_RELEASE_TABLET | Freq: Every day | ORAL | Status: DC | PRN

## 2015-11-16 MED ORDER — AMLODIPINE BESYLATE 10 MG PO TABS
10.0000 mg | ORAL_TABLET | Freq: Every day | ORAL | Status: DC
  Administered 2015-11-17 – 2015-11-18 (×2): 10 mg via ORAL
  Filled 2015-11-16 (×2): qty 1

## 2015-11-16 MED ORDER — ALBUTEROL SULFATE HFA 108 (90 BASE) MCG/ACT IN AERS
INHALATION_SPRAY | RESPIRATORY_TRACT | Status: DC | PRN
Start: 2015-11-16 — End: 2015-11-16
  Administered 2015-11-16: 2 via RESPIRATORY_TRACT

## 2015-11-16 MED ORDER — MIDAZOLAM HCL 2 MG/2ML IJ SOLN
INTRAMUSCULAR | Status: AC
  Filled 2015-11-16: qty 2

## 2015-11-16 MED ORDER — MIDAZOLAM HCL 5 MG/5ML IJ SOLN
INTRAMUSCULAR | Status: DC | PRN
  Administered 2015-11-16: 2 mg via INTRAVENOUS

## 2015-11-16 MED ORDER — HYDRALAZINE HCL 20 MG/ML IJ SOLN: 5.0000 mg | INTRAMUSCULAR | Status: DC | PRN

## 2015-11-16 MED ORDER — MORPHINE SULFATE (PF) 2 MG/ML IV SOLN: 2.0000 mg | INTRAVENOUS | Status: DC | PRN

## 2015-11-16 MED ORDER — ASPIRIN EC 81 MG PO TBEC
81.0000 mg | DELAYED_RELEASE_TABLET | Freq: Every day | ORAL | Status: DC
  Administered 2015-11-17 – 2015-11-18 (×2): 81 mg via ORAL
  Filled 2015-11-16 (×2): qty 1

## 2015-11-16 MED ORDER — METOPROLOL TARTRATE 1 MG/ML IV SOLN: 2.0000 mg | INTRAVENOUS | Status: DC | PRN

## 2015-11-16 MED ORDER — DOCUSATE SODIUM 100 MG PO CAPS
100.0000 mg | ORAL_CAPSULE | Freq: Every day | ORAL | Status: DC
  Administered 2015-11-17 – 2015-11-18 (×2): 100 mg via ORAL
  Filled 2015-11-16 (×2): qty 1

## 2015-11-16 MED ORDER — ACETAMINOPHEN 325 MG PO TABS: 325.0000 mg | ORAL_TABLET | ORAL | Status: DC | PRN

## 2015-11-16 SURGICAL SUPPLY — 50 items
BANDAGE ESMARK 6X9 LF (GAUZE/BANDAGES/DRESSINGS) ×1 IMPLANT
BNDG ESMARK 6X9 LF (GAUZE/BANDAGES/DRESSINGS) ×3
CANISTER SUCTION 2500CC (MISCELLANEOUS) ×3 IMPLANT
CANNULA VESSEL 3MM 2 BLNT TIP (CANNULA) ×3 IMPLANT
CATH EMB 3FR 80CM (CATHETERS) ×3 IMPLANT
CLIP TI MEDIUM 24 (CLIP) ×3 IMPLANT
CLIP TI WIDE RED SMALL 24 (CLIP) ×3 IMPLANT
COVER PROBE W GEL 5X96 (DRAPES) ×3 IMPLANT
CUFF TOURNIQUET SINGLE 34IN LL (TOURNIQUET CUFF) ×3 IMPLANT
ELECT REM PT RETURN 9FT ADLT (ELECTROSURGICAL) ×3
ELECTRODE REM PT RTRN 9FT ADLT (ELECTROSURGICAL) ×1 IMPLANT
GLOVE BIO SURGEON STRL SZ 6.5 (GLOVE) ×4 IMPLANT
GLOVE BIO SURGEON STRL SZ7 (GLOVE) ×6 IMPLANT
GLOVE BIO SURGEONS STRL SZ 6.5 (GLOVE) ×2
GLOVE BIOGEL PI IND STRL 6.5 (GLOVE) ×3 IMPLANT
GLOVE BIOGEL PI IND STRL 7.0 (GLOVE) ×6 IMPLANT
GLOVE BIOGEL PI IND STRL 7.5 (GLOVE) ×2 IMPLANT
GLOVE BIOGEL PI INDICATOR 6.5 (GLOVE) ×6
GLOVE BIOGEL PI INDICATOR 7.0 (GLOVE) ×12
GLOVE BIOGEL PI INDICATOR 7.5 (GLOVE) ×4
GLOVE ECLIPSE 6.5 STRL STRAW (GLOVE) ×12 IMPLANT
GLOVE SS BIOGEL STRL SZ 7 (GLOVE) ×1 IMPLANT
GLOVE SUPERSENSE BIOGEL SZ 7 (GLOVE) ×2
GOWN STRL REUS W/ TWL LRG LVL3 (GOWN DISPOSABLE) ×8 IMPLANT
GOWN STRL REUS W/TWL LRG LVL3 (GOWN DISPOSABLE) ×16
HEMOSTAT SPONGE AVITENE ULTRA (HEMOSTASIS) ×3 IMPLANT
KIT BASIN OR (CUSTOM PROCEDURE TRAY) ×3 IMPLANT
KIT ROOM TURNOVER OR (KITS) ×3 IMPLANT
LIQUID BAND (GAUZE/BANDAGES/DRESSINGS) ×9 IMPLANT
NS IRRIG 1000ML POUR BTL (IV SOLUTION) ×6 IMPLANT
PACK PERIPHERAL VASCULAR (CUSTOM PROCEDURE TRAY) ×3 IMPLANT
PAD ARMBOARD 7.5X6 YLW CONV (MISCELLANEOUS) ×6 IMPLANT
STAPLER VISISTAT 35W (STAPLE) ×3 IMPLANT
SUT MNCRL AB 4-0 PS2 18 (SUTURE) ×12 IMPLANT
SUT PROLENE 6 0 BV (SUTURE) ×21 IMPLANT
SUT SILK 2 0 (SUTURE) ×2
SUT SILK 2-0 18XBRD TIE 12 (SUTURE) ×1 IMPLANT
SUT SILK 3 0 (SUTURE) ×2
SUT SILK 3-0 18XBRD TIE 12 (SUTURE) ×1 IMPLANT
SUT VIC AB 2-0 CT1 27 (SUTURE) ×4
SUT VIC AB 2-0 CT1 TAPERPNT 27 (SUTURE) ×2 IMPLANT
SUT VIC AB 3-0 SH 27 (SUTURE) ×8
SUT VIC AB 3-0 SH 27X BRD (SUTURE) ×4 IMPLANT
SUT VICRYL 4-0 PS2 18IN ABS (SUTURE) ×3 IMPLANT
SYR TB 1ML LUER SLIP (SYRINGE) ×3 IMPLANT
TAPE UMBILICAL COTTON 1/8X30 (MISCELLANEOUS) ×3 IMPLANT
TOWEL OR 17X24 6PK STRL BLUE (TOWEL DISPOSABLE) ×3 IMPLANT
TRAY FOLEY W/METER SILVER 16FR (SET/KITS/TRAYS/PACK) ×3 IMPLANT
UNDERPAD 30X30 INCONTINENT (UNDERPADS AND DIAPERS) ×3 IMPLANT
WATER STERILE IRR 1000ML POUR (IV SOLUTION) ×3 IMPLANT

## 2015-11-16 NOTE — Transfer of Care (Signed)
Immediate Anesthesia Transfer of Care Note  Patient: Estell HarpinKeith A [place]  Procedure(s) Performed: Procedure(s): BYPASS GRAFT POPLITEAL TO POPLITEAL; POPLITEAL ARTERY ANEURYSM EXCLUSION (Left) VEIN HARVEST - LEFT GREATER SAPPHENOUS (Left)  Patient Location: PACU  Anesthesia Type:General  Level of Consciousness: awake, alert , oriented and patient cooperative  Airway & Oxygen Therapy: Patient Spontanous Breathing and Patient connected to face mask oxygen  Post-op Assessment: Report given to RN, Post -op Vital signs reviewed and stable, Patient moving all extremities and Patient moving all extremities X 4  Post vital signs: Reviewed and stable  Last Vitals:  Filed Vitals:   11/16/15 0632 11/16/15 1313  BP: 143/88   Pulse: 78   Temp: 36.9 C 36.9 C  Resp: 18     Complications: No apparent anesthesia complications

## 2015-11-16 NOTE — H&P (View-Only) (Signed)
Established Popliteal Aneurysm  History of Present Illness  Douglas Anthony is a 54 y.o. (October 07, 1961) male who presents with chief complaint: left posterior calf/knee pain.  The patient's symptoms have not progressed.  The patient's symptoms are: aching pain behind knee extending into calf.  The patient's treatment regimen currently included: maximal medical management.  Pt returns after his CTA abd/pelvis with runoff.  Past Medical History  Diagnosis Date  . Hypertension   . Diabetes mellitus without complication (HCC)   . Blood clot in vein     left leg; Tx Oral medication    Past Surgical History  Procedure Laterality Date  . Knee surgery Right 1996  . Inner ear surgery      Social History   Social History  . Marital Status: Married    Spouse Name: N/A  . Number of Children: N/A  . Years of Education: N/A   Occupational History  . Not on file.   Social History Main Topics  . Smoking status: Light Tobacco Smoker -- 0.50 packs/day    Types: Cigarettes  . Smokeless tobacco: Never Used  . Alcohol Use: No  . Drug Use: No  . Sexual Activity: Not on file   Other Topics Concern  . Not on file   Social History Narrative   Family History: Mother has DM, HTN, HLD  Current Outpatient Prescriptions  Medication Sig Dispense Refill  . amLODipine (NORVASC) 10 MG tablet     . amLODipine (NORVASC) 2.5 MG tablet Take 10 mg by mouth daily.     Marland Kitchen aspirin 81 MG tablet Take 81 mg by mouth daily.    Marland Kitchen atorvastatin (LIPITOR) 40 MG tablet Take 20 mg by mouth daily.     Marland Kitchen etodolac (LODINE) 500 MG tablet Take 500 mg by mouth 2 (two) times daily.    Marland Kitchen LORazepam (ATIVAN) 1 MG tablet PRN  3  . metFORMIN (GLUCOPHAGE) 500 MG tablet Take 500 mg by mouth daily.    . quinapril (ACCUPRIL) 40 MG tablet Take 40 mg by mouth daily.    Marland Kitchen aspirin 325 MG tablet Take 325 mg by mouth daily.    Marland Kitchen atorvastatin (LIPITOR) 20 MG tablet     . HYDROcodone-acetaminophen (NORCO) 5-325 MG tablet Take 1  tablet by mouth every 6 (six) hours as needed for severe pain. (Patient not taking: Reported on 11/09/2015) 15 tablet 0   No current facility-administered medications for this visit.     No Known Allergies   REVIEW OF SYSTEMS:  (Positives checked otherwise negative)  CARDIOVASCULAR:    chest pain,   chest pressure,   palpitations,   shortness of breath when laying flat,   shortness of breath with exertion,    pain in feet when walking,   pain in feet when laying flat,  history of blood clot in veins (DVT),   history of phlebitis,   swelling in legs,   varicose veins  PULMONARY:    productive cough,   asthma,   wheezing  NEUROLOGIC:    weakness in arms or legs,   numbness in arms or legs,   difficulty speaking or slurred speech,   temporary loss of vision in one eye,   dizziness  HEMATOLOGIC:    bleeding problems,   problems with blood clotting too easily  MUSCULOSKEL:    joint pain,  joint swelling  GASTROINTEST:   [ ]  vomiting blood,  [ ]  blood in stool     GENITOURINARY:   [ ]  burning with urination,  [ ]  blood in urine  PSYCHIATRIC:   [ ]  history of major depression  INTEGUMENTARY:   [ ]  rashes,  [ ]  ulcers  CONSTITUTIONAL:   [ ]  fever,  [ ]  chills     Physical Examination  Filed Vitals:   11/09/15 1249  BP: 128/92  Pulse: 93  Temp: 97.6 F (36.4 C)  Resp: 16  Height: 5\' 9"  (1.753 m)  Weight: 229 lb (103.874 kg)  SpO2: 96%   Body mass index is 33.8 kg/(m^2).  General: A&O x 3, WDWN  Pulmonary: Sym exp, good air movt, CTAB, no rales, rhonchi, & wheezing  Cardiac: RRR, Nl S1, S2, no Murmurs, rubs or gallops  Vascular: Vessel Right Left  Radial Palpable Palpable  Brachial Palpable Palpable  Carotid Palpable, without bruit Palpable, without bruit  Aorta Not palpable N/A  Femoral Palpable Palpable  Popliteal Not palpable Prominently palpable  PT Palpable  Palpable  DP Palpable Palpable   Gastrointestinal: soft, NTND, no G/R, no HSM, no masses, no CVAT B  Musculoskeletal: M/S 5/5 throughout , Extremities without ischemic changes   Neurologic: Pain and light touch intact in extremities , Motor exam as listed above  CTA abd/pelvis with runoff (11/02/15) 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 No popliteal artery occlusion.  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  No significant arterial occlusive disease in the abdomen, pelvis or lower extremities.  No acute abnormality in the abdomen or pelvis.  Degenerative changes in the knees with joint effusions.  Based on my review of this CTA, this patient has a large L popliteal artery aneurysm which intact runoff.  There is already thrombus in the aneurysm.  The location of the aneurysm makes it not amendable to stenting.  There is no evidence of AAA.  BLE GSV Mapping (11/02/15) Both vein good conduits  Medical Decision Making  Douglas Anthony is a 54 y.o. male who presents with:  Large L popliteal artery aneurysm, R popliteal artery ectasia   Based on the patient's vascular studies and examination, I have offered the patient: L popliteal artery aneurysm exclusion, L above-the-knee to below-the-knee popliteal bypass with L GSV.  He is scheduled for this coming 5 DEC 16.  The risk, benefits, and alternative for bypass operations were discussed with the patient.  The patient is aware the risks include but are not limited to: bleeding, infection, myocardial infarction, stroke, limb loss, nerve damage, need for additional procedures in the future, wound complications, and inability to complete the bypass.    The patient is aware of these risks and agreed to proceed.  Thank you for allowing us to participate in this patient's care.   Leonides SakeBrian Chen, MD [facility] Office: [phone] Pager:  [phone]  11/09/2015, 1:32 PM

## 2015-11-16 NOTE — Interval H&P Note (Signed)
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 After consideration of risks, benefits and other options for treatment, the patient has consented to  Procedure(s): BYPASS GRAFT POPLITEAL TO POPLITEAL; POPLITEAL ARTERY ANEURYSM EXCLUSION (Left) as a surgical intervention .  The patient's history has been reviewed, patient examined, no change in status, stable for surgery.  I have reviewed the patient's chart and labs.  Questions were answered to the patient's satisfaction.     Leonides Sakehen, Nickey Canedo

## 2015-11-16 NOTE — Progress Notes (Signed)
  Day of Surgery Note    Subjective:  No complaints  Afebrile HR 70's-80's 100's-110's systolic 96% 3LO2NC  Filed Vitals:   11/16/15 1530 11/16/15 1545  BP:    Pulse: 79 81  Temp:    Resp: 17 18    Incisions:   All look fine Extremities:  + palpable left PT/DP   Assessment/Plan:  This is a 54 y.o. male who is s/p  1. Exclusion of left popliteal aneurysm 2. Left above the knee popliteal artery to below-the-knee popliteal artery bypass with ipsilateral non-reversed popliteal artery  -pt doing well this afternoon with easily palpable left pedal pulses -to 3 south when bed available   Doreatha MassedSamantha Gabriana Wilmott, PA-C 11/16/2015 3:51 PM

## 2015-11-16 NOTE — Anesthesia Procedure Notes (Signed)
Procedure Name: Intubation Date/Time: 11/16/2015 8:39 AM Performed by: Adonis HousekeeperNGELL, Rahn Lacuesta M Pre-anesthesia Checklist: Patient identified, Emergency Drugs available, Suction available and Patient being monitored Patient Re-evaluated:Patient Re-evaluated prior to inductionOxygen Delivery Method: Circle system utilized Preoxygenation: Pre-oxygenation with 100% oxygen Intubation Type: IV induction Ventilation: Mask ventilation without difficulty and Oral airway inserted - appropriate to patient size Laryngoscope Size: Glidescope and 3 Grade View: Grade I Tube type: Oral Tube size: 7.5 mm Number of attempts: 1 Airway Equipment and Method: Stylet Placement Confirmation: ETT inserted through vocal cords under direct vision,  positive ETCO2 and breath sounds checked- equal and bilateral Secured at: 22 cm Tube secured with: Tape Dental Injury: Teeth and Oropharynx as per pre-operative assessment

## 2015-11-16 NOTE — Anesthesia Postprocedure Evaluation (Signed)
Anesthesia Post Note  Patient: Douglas Anthony  Procedure(s) Performed: Procedure(s) (LRB): BYPASS GRAFT POPLITEAL TO POPLITEAL; POPLITEAL ARTERY ANEURYSM EXCLUSION (Left) VEIN HARVEST - LEFT GREATER SAPPHENOUS (Left)  Patient location during evaluation: PACU Anesthesia Type: General Level of consciousness: awake and sedated Pain management: pain level controlled Vital Signs Assessment: post-procedure vital signs reviewed and stable Respiratory status: spontaneous breathing Cardiovascular status: stable Anesthetic complications: no    Last Vitals:  Filed Vitals:   11/16/15 1330 11/16/15 1345  BP: 107/74 115/79  Pulse: 73 74  Temp:    Resp: 25 21    Last Pain:  Filed Vitals:   11/16/15 1348  PainSc: 0-No pain                 Zailyn Thoennes,JAMES TERRILL

## 2015-11-16 NOTE — Anesthesia Preprocedure Evaluation (Addendum)
Anesthesia Evaluation  Patient identified by MRN, date of birth, ID band Patient awake    History of Anesthesia Complications (+) PONV  Airway Mallampati: III  TM Distance: <3 FB Neck ROM: Limited    Dental  (+) Teeth Intact   Pulmonary Current Smoker,    breath sounds clear to auscultation       Cardiovascular hypertension, + Peripheral Vascular Disease   Rhythm:Regular Rate:Normal     Neuro/Psych    GI/Hepatic   Endo/Other  diabetes  Renal/GU      Musculoskeletal  (+) Arthritis ,   Abdominal (+) + obese,   Peds  Hematology   Anesthesia Other Findings   Reproductive/Obstetrics                            Anesthesia Physical Anesthesia Plan  ASA: III  Anesthesia Plan: General   Post-op Pain Management:    Induction: Intravenous  Airway Management Planned: Oral ETT and Video Laryngoscope Planned  Additional Equipment:   Intra-op Plan:   Post-operative Plan: Extubation in OR  Informed Consent: I have reviewed the patients History and Physical, chart, labs and discussed the procedure including the risks, benefits and alternatives for the proposed anesthesia with the patient or authorized representative who has indicated his/her understanding and acceptance.   Dental advisory given  Plan Discussed with: CRNA and Surgeon  Anesthesia Plan Comments:        Anesthesia Quick Evaluation

## 2015-11-16 NOTE — Op Note (Addendum)
OPERATIVE NOTE   PROCEDURE: 1.  Exclusion of left popliteal aneurysm 2.  Left above the knee popliteal artery to below-the-knee popliteal artery bypass with ipsilateral non-reversed greater saphenous vein  PRE-OPERATIVE DIAGNOSIS: left large popliteal aneurysm with thrombus  POST-OPERATIVE DIAGNOSIS: same as above   SURGEON: Leonides SakeBrian Keats Kingry, MD  ASSISTANT(S): Dr. Hart RochesterLawson; Karsten RoKim Trinh, PAC; Doreatha MassedSamantha Rhyne, [facility]   ANESTHESIA: general  ESTIMATED BLOOD LOSS: 200 cc  FINDING(S): 1.  Excellent greater saphenous vein conduit 2.  Normal above-the-knee popliteal artery 3.  Diseased below-the-knee popliteal artery with calcified plaque present.   4.  Dopplerable left posterior tibial artery and anterior tibial artery   SPECIMEN(S):  none  INDICATIONS:   Douglas Anthony is a 54 y.o. male who presents with large left popliteal artery aneurysm. The patient's CTA demonstrates position of the aneurysm at the knee extending slightly into the below-the-knee segment.  There was also presence of thrombus in this aneurysm.  I recommended: left popliteal artery exclusion with above-the-knee to below-the-knee popliteal artery bypass.  The risk, benefits, and alternative for bypass operations were discussed with the patient.  The patient is aware the risks include but are not limited to: bleeding, infection, myocardial infarction, stroke, limb loss, nerve damage, need for additional procedures in the future, wound complications, and inability to complete the bypass.  The patient is aware of these risks and agreed to proceed.   DESCRIPTION: After full informed written consent was obtained, the patient was brought back to the operating room and placed supine upon the operating table.  Prior to induction, the patient was given intravenous antibiotics.  After obtaining adequate anesthesia, the patient was prepped and draped in the standard fashion for a femoral to popliteal bypass operation.    Attention was  turned to the left distal thigh.  A longitudinal incision was made over Hunter's canal.  Using blunt dissection and electrocautery, the artery was dissected out proximally and distally in Hunter's canal.  The artery was soft and ~5-6 mm at this location.    At this point, attention was turned to the calf.  An longitudinal incision was made one finger-width posterior to the tibia.  Using blunt dissection and electrocautery, a plane was developed through the subcutaneous tissue and fascia down to the popliteal space.  The popliteal vein was dissected out and retracted medially and posteriorly.  The below-the-knee popliteal artery was dissected away from lateral popliteal vein.  This popliteal artery was found on exam to be calcified.   At this point, the patient's left greater saphenous vein was identified under Sonosite guidance.  Skip incisions was made over the greater saphenous vein from the proximal thigh down to distal thigh.  The vein conduit was found to be adequate with size: 7-8 mm tapered down to 5-6 mm.  Side branches of greater saphenous vein were tied off with 4-0 silk or clipped with small titanium clips.  The vein was ligated proximally and distally with 2-0 silk ties.  The conduit was sharply harvested.  The vein conduit was soaked in a heparinized saline solution.  A vessel cannula was tied to the distal end of the vein and the vein was tested by hydrodistension.  Leaks in the conduit were repaired with 4-0 silk ties and 7-0 Prolene stitches.  At the end of this process, I felt the conduit to be:  Excellent quality.  At this point, the patient was given 11000 units of Heparin intravenously, which was a therapeutic bolus.  In total,  15000 units of Heparin was administrated to achieve and maintain a therapeutic level of anticoagulation, giving another 2000 units every subsequent hourly.  After waiting three minutes, I clamped the distal superficial femoral artery and then tied off the distal  above-the-knee popliteal artery with two 2-0 silk ties.  I transected the popliteal artery sharply.  The artery was mostly disease free.  I spatulated this artery to facilitate an end-to-end anastomosis.  The vein conduit was spatulated to the dimensions of the arteriotomy.  The conduit was sewn to the above-the-knee popliteal artery with a running stitch of 6-0 prolene in an end-to-end configuration.  I then released the clamp.  There was an excellent pulse in the vein graft.  Due to competent valves there was no bleeding.  I passed a valvutome twice to lyse valves in this conduit.  There was excellent pulsatile bleeding this vein conduit.  I clamped the artery and marked the orientation of the vein.  I then applied a tourniquet on the proximal thigh.  I exsanguinated the blood in this leg with an Esmark bandage and then inflated this tourniqet to 250 mm Hg.  I drained the blood from the vein conduit and instill heparinized saline.  At this point, I bluntly dissected a space between the femoral condyles adjacent to the below-the-knee popliteal vessels.  I bluntly passed a sponge clamp between the below-the-knee to above-the-knee exposure.  I clamped the vein and passed the conduit through, taking care to maintain the orientation of the conduit.    Attention was then turned to the popliteal exposure.   I reset the exposure of the popliteal space.  I verified the popliteal artery was appropriately marked.  I determine the target segment for the anastomosis.  I tied off the popliteal artery proximally with two 2-0 silks.  I then sharply transected the below-the-knee popliteal artery.  This artery was diseased with calcified plaque.  I spatulated this artery to facilitate an end-to-end anastomosis.  I felt it was adequate as a target however.  I pulled the conduit to appropriate tension and length, taking into account straightening out the leg.  I adjusted the length of the conduit sharply.  I spatulated this  conduit to meet the dimensions of the arteriotomy.  The conduit was sewn to the below-the-knee popliteal artery with a running stitch of 6-0 prolene in an end-to-end fashion, taking care to take big bites to tack down the plaque.  I deflated the tourniquet prior to completing this anastomosis, all vessels were backbled.  No thrombus was noted from any vessels.  The vein conduit bleeding was excellent and pulsatile.  Backbleeding from the popliteal artery was somewhat limited, so I passed a 3 Fogarty down to level of the foot without any thrombus obtained.  I also passed a 4 mm dilator distally without difficulties.  The anastomosis was completed in the usual fashion.  All clamps were removed.  There was some bleeding from the distal anastomosis that required a suture with 6-0 prolene to repair.  At this point, all incisions were washed out and Avitene was placed in the above-the-knee and below-the-knee  incisions.  The continuous doppler exam demonstrated distally: multiphasic posterior tibial artery signal and monophasic anterior tibial artery.    At this point, bleeding in both the above-the-knee and below-the-knee incisions were controlled with electrocautery and suture ligature.  After another round of Avitene, no further active bleeding was present.  I washed out both incisions.  The calf was repaired with a  running stitch of 3-0 Vicryl in the subcutaneous tissue.  The skin was reapproximated with a running subcuticular of 4-0 Monocryl.  After cleaning and drying the skin, the skin was reinforced with Dermabond.  Attention was turned to the distal thigh.  The subcutaneous tissue was reapproximated with a layer of 3-0 Vicryl.  The skin was reapproximated with a running subcuticular of 4-0 Vicryl.  The skin was cleaned, dried, and reinforced with Dermabond.  The two vein harvest incisions were closed with a layer of 2-0 Vicryl in the deep subcutaneous tissue and then a layer of 3-0 Vicryl in the  superficial subcutaneous tissue.  The skin was then reapproximated with a running subcuticular stich of 4-0 Monocryl.  The skin was cleaned, dried, and sterile bandages applied.  At the end of this case this patient had a palpable posterior tibial artery with intact anterior tibial artery signal.    COMPLICATIONS: none  CONDITION: stable   Leonides Sake, MD [facility] [place] [place] Office: [phone] Pager: [phone]  11/16/2015, 1:05 PM

## 2015-11-17 ENCOUNTER — Encounter (HOSPITAL_COMMUNITY): Payer: Self-pay | Admitting: Vascular Surgery

## 2015-11-17 ENCOUNTER — Telehealth: Payer: Self-pay | Admitting: Vascular Surgery

## 2015-11-17 LAB — GLUCOSE, CAPILLARY
GLUCOSE-CAPILLARY: 153 mg/dL — AB (ref 65–99)
Glucose-Capillary: 203 mg/dL — ABNORMAL HIGH (ref 65–99)
Glucose-Capillary: 141 mg/dL — ABNORMAL HIGH (ref 65–99)
Glucose-Capillary: 143 mg/dL — ABNORMAL HIGH (ref 65–99)

## 2015-11-17 LAB — CBC
HEMATOCRIT: 42.8 % (ref 39.0–52.0)
HEMOGLOBIN: 14.6 g/dL (ref 13.0–17.0)
MCH: 30.9 pg (ref 26.0–34.0)
MCHC: 34.1 g/dL (ref 30.0–36.0)
MCV: 90.7 fL (ref 78.0–100.0)
Platelets: 181 10*3/uL (ref 150–400)
RBC: 4.72 MIL/uL (ref 4.22–5.81)
RDW: 12 % (ref 11.5–15.5)
WBC: 12 10*3/uL — ABNORMAL HIGH (ref 4.0–10.5)

## 2015-11-17 LAB — BASIC METABOLIC PANEL
Anion gap: 8 (ref 5–15)
BUN: 10 mg/dL (ref 6–20)
CHLORIDE: 102 mmol/L (ref 101–111)
CO2: 27 mmol/L (ref 22–32)
CREATININE: 0.95 mg/dL (ref 0.61–1.24)
Calcium: 8.7 mg/dL — ABNORMAL LOW (ref 8.9–10.3)
GFR calc non Af Amer: >60 mL/min (ref >60)
GLUCOSE: 151 mg/dL — AB (ref 65–99)
Potassium: 4.1 mmol/L (ref 3.5–5.1)
Sodium: 137 mmol/L (ref 135–145)

## 2015-11-17 MED ORDER — OXYCODONE HCL 5 MG PO TABS: 5.0000 mg | ORAL_TABLET | Freq: Four times a day (QID) | ORAL | Status: DC | PRN

## 2015-11-17 MED ORDER — METFORMIN HCL 500 MG PO TABS
500.0000 mg | ORAL_TABLET | Freq: Two times a day (BID) | ORAL | Status: DC
  Administered 2015-11-17 – 2015-11-18 (×2): 500 mg via ORAL
  Filled 2015-11-17 (×2): qty 1

## 2015-11-17 MED ORDER — METFORMIN HCL 500 MG PO TABS: 500.0000 mg | ORAL_TABLET | Freq: Two times a day (BID) | ORAL | Status: DC

## 2015-11-17 NOTE — Telephone Encounter (Signed)
Spoke with pt, dpm °

## 2015-11-17 NOTE — Progress Notes (Signed)
Occupational Therapy Evaluation Patient Details Name: Douglas Anthony MRN: [id number] DOB: 10-Nov-1961 Today's Date: 11/17/2015    History of Present Illness s/p Left above the knee popliteal artery to below-the-knee popliteal artery bypass with ipsilateral non-reversed popliteal artery   Clinical Impression   Pt close to baseline level of function. Pt states he is moving and doing better now than before his surgery. Pt has appropriate level of S available for safe D/C home. Encouraged ambulation with staff. OT signing off.     Follow Up Recommendations  No OT follow up;Supervision - Intermittent    Equipment Recommendations  None recommended by OT    Recommendations for Other Services       Precautions / Restrictions Precautions Precautions: None      Mobility Bed Mobility Overal bed mobility:  (OOB in chair)                Transfers Overall transfer level: Modified independent                    Balance Overall balance assessment: No apparent balance deficits (not formally assessed)                                          ADL Overall ADL's : At baseline                                       General ADL Comments: Close to baseline level of function. disucssed reducing risk of falls at home and recommendation to have wife close initially when bathing.     Vision     Perception     Praxis      Pertinent Vitals/Pain       Hand Dominance Right   Extremity/Trunk Assessment Upper Extremity Assessment Upper Extremity Assessment: Overall WFL for tasks assessed   Lower Extremity Assessment Lower Extremity Assessment: Defer to PT evaluation   Cervical / Trunk Assessment Cervical / Trunk Assessment: Normal   Communication Communication Communication: No difficulties   Cognition Arousal/Alertness: Awake/alert Behavior During Therapy: WFL for tasks assessed/performed Overall Cognitive Status: Within  Functional Limits for tasks assessed                     General Comments       Exercises       Shoulder Instructions      Home Living Family/patient expects to be discharged to:: Private residence Living Arrangements: Spouse/significant other Available Help at Discharge: Family;Available PRN/intermittently Type of Home: House             Bathroom Shower/Tub: Walk-in Human resources officer: Standard Bathroom Accessibility: Yes How Accessible: Accessible via walker Home Equipment: Shower seat - built in          Prior Functioning/Environment Level of Independence: Independent             OT Diagnosis: Acute pain;Generalized weakness   OT Problem List: Decreased range of motion;Pain   OT Treatment/Interventions:      OT Goals(Current goals can be found in the care plan section) Acute Rehab OT Goals Patient Stated Goal: to go home OT Goal Formulation: All assessment and education complete, DC therapy  OT Frequency:     Barriers to D/C:  Co-evaluation              End of Session Nurse Communication: Mobility status  Activity Tolerance: Patient tolerated treatment well Patient left: in chair;with call bell/phone within reach;with family/visitor present   Time: 1206-1218 OT Time Calculation (min): 12 min Charges:  OT General Charges $OT Visit: 1 Procedure OT Evaluation $Initial OT Evaluation Tier I: 1 Procedure G-Codes:    Marylee Belzer,HILLARY 11/17/2015, 12:18 PM   Luisa DagoHilary Tashiba Timoney, OTR/L  [phone] 11/17/2015

## 2015-11-17 NOTE — Progress Notes (Signed)
UR COMPLETED  

## 2015-11-17 NOTE — Evaluation (Signed)
Physical Therapy Evaluation Patient Details Name: Douglas Anthony MRN: [id number] DOB: 06/21/1961 Today's Date: 11/17/2015   History of Present Illness  Pt is a 54 y/o M admitted with left large popliteal aneurysm with thrombus, s/p exclusion of left popliteal aneurysm and left above the knee popliteal artery to below-the-knee popliteal artery bypass with ipsilateral non-reversed popliteal artery.  Pt's PMH inlcudes Rt knee surgery.  Clinical Impression  Pt admitted with above diagnosis. Pt currently with functional limitations due to the deficits listed below (see PT Problem List). Supervision provided to pt ambulating in hallway w/ RW and no AD.  Pt completed stair training w/ supervision.  Will benefit from RW for safety w/ ambulation at home.  Pt is appropriate for d/c from a mobility standpoint. Pt will benefit from skilled PT to increase their independence and safety with mobility to allow discharge to the venue listed below.      Follow Up Recommendations No PT follow up;Supervision - Intermittent    Equipment Recommendations  Rolling walker with 5" wheels    Recommendations for Other Services       Precautions / Restrictions Precautions Precautions: Fall Precaution Comments: Reviewed importance of light AROM exercises to dec stiffness Restrictions Weight Bearing Restrictions: No      Mobility  Bed Mobility Overal bed mobility:  (OOB in chair)             General bed mobility comments: Pt sitting in recliner chair upon PT arrival  Transfers Overall transfer level: Modified independent Equipment used: Rolling walker (2 wheeled)             General transfer comment: Cues to reach back for armrests when sitting  Ambulation/Gait Ambulation/Gait assistance: Supervision Ambulation Distance (Feet): 250 Feet Assistive device: Rolling walker (2 wheeled);None Gait Pattern/deviations: Step-through pattern;Decreased stance time - left;Decreased step length -  right;Antalgic;Decreased weight shift to left     General Gait Details: RW for 150 ft and no AD for the last 100 ft w/ supervision for safety, very min instability noted.  Stairs Stairs: Yes Stairs assistance: Supervision Stair Management: One rail Right;Step to pattern;Forwards Number of Stairs: 5 General stair comments: cues to hold onto railing, supervision for pt's safety, no physical assist needed  Wheelchair Mobility    Modified Rankin (Stroke Patients Only)       Balance Overall balance assessment: Needs assistance Sitting-balance support: Feet supported Sitting balance-Leahy Scale: Good     Standing balance support: During functional activity Standing balance-Leahy Scale: Fair Standing balance comment: supervision for safety                             Pertinent Vitals/Pain Pain Assessment: Faces Faces Pain Scale: Hurts a little bit Pain Location: Lt LE tightness Pain Descriptors / Indicators: Tightness Pain Intervention(s): Limited activity within patient's tolerance;Monitored during session    Home Living Family/patient expects to be discharged to:: Private residence Living Arrangements: Spouse/significant other Available Help at Discharge: Family;Available PRN/intermittently;Neighbor (wife at home w/ pt "3/4 of the day") Type of Home: House Home Access: Stairs to enter Entrance Stairs-Rails: Lawyer of Steps: 5 Home Layout: One level Home Equipment: Cane - single point;Crutches      Prior Function Level of Independence: Independent               Hand Dominance   Dominant Hand: Right    Extremity/Trunk Assessment   Upper Extremity Assessment: Defer to OT evaluation  Lower Extremity Assessment: LLE deficits/detail   LLE Deficits / Details: weakness as expected   Cervical / Trunk Assessment: Normal  Communication   Communication: No difficulties  Cognition Arousal/Alertness:  Awake/alert Behavior During Therapy: WFL for tasks assessed/performed Overall Cognitive Status: Within Functional Limits for tasks assessed                      General Comments General comments (skin integrity, edema, etc.): Instructed pt to use either RW or cane at home for increased stability.    Exercises General Exercises - Lower Extremity Ankle Circles/Pumps: AROM;Both;10 reps;Seated Long Arc Quad: AROM;Left;10 reps;Seated      Assessment/Plan    PT Assessment Patient needs continued PT services  PT Diagnosis Difficulty walking;Acute pain   PT Problem List Decreased strength;Decreased range of motion;Decreased activity tolerance;Decreased balance;Decreased mobility;Decreased knowledge of use of DME;Decreased safety awareness;Decreased knowledge of precautions;Pain  PT Treatment Interventions DME instruction;Gait training;Stair training;Functional mobility training;Therapeutic activities;Therapeutic exercise;Balance training;Neuromuscular re-education;Patient/family education   PT Goals (Current goals can be found in the Care Plan section) Acute Rehab PT Goals Patient Stated Goal: to go home PT Goal Formulation: With patient Time For Goal Achievement: 11/24/15 Potential to Achieve Goals: Good    Frequency Min 2X/week   Barriers to discharge        Co-evaluation               End of Session Equipment Utilized During Treatment: Gait belt Activity Tolerance: Patient tolerated treatment well Patient left: in chair;with call bell/phone within reach;with family/visitor present Nurse Communication: Mobility status         Time: [id number] PT Time Calculation (min) (ACUTE ONLY): 15 min   Charges:   PT Evaluation $Initial PT Evaluation Tier I: 1 Procedure     PT G CodesMichail Anthony:       Douglas Anthony PT, DPT [phone] Pager: [phone] 11/17/2015, 12:24 PM

## 2015-11-17 NOTE — Progress Notes (Addendum)
  Progress Note    11/17/2015 7:22 AM 1 Day Post-Op  Subjective:  No complaints  Afebrile HR 70's-80's NSR 100's-110's systolic 94% RA  Filed Vitals:   11/17/15 0445 11/17/15 0545  BP:    Pulse:    Temp: 98.3 F (36.8 C)   Resp:  12    Physical Exam: Cardiac:  regular Lungs:  Non labored Incisions:  Clean and dry Extremities:  Left foot is warm with easily palpable left DP/PT   CBC    Component Value Date/Time   WBC 12.0* 11/17/2015 0518   RBC 4.72 11/17/2015 0518   HGB 14.6 11/17/2015 0518   HCT 42.8 11/17/2015 0518   PLT 181 11/17/2015 0518   MCV 90.7 11/17/2015 0518   MCH 30.9 11/17/2015 0518   MCHC 34.1 11/17/2015 0518   RDW 12.0 11/17/2015 0518    BMET    Component Value Date/Time   NA 137 11/17/2015 0518   K 4.1 11/17/2015 0518   CL 102 11/17/2015 0518   CO2 27 11/17/2015 0518   GLUCOSE 151* 11/17/2015 0518   BUN 10 11/17/2015 0518   CREATININE 0.95 11/17/2015 0518   CALCIUM 8.7* 11/17/2015 0518   GFRNONAA >60 11/17/2015 0518   GFRAA >60 11/17/2015 0518    INR    Component Value Date/Time   INR 1.04 11/11/2015 1021     Intake/Output Summary (Last 24 hours) at 11/17/15 [id number] Last data filed at 11/17/15 0500  Gross per 24 hour  Intake   2515 ml  Output   2885 ml  Net   -370 ml     Assessment:  54 y.o. male is s/p:  1. Exclusion of left popliteal aneurysm 2. Left above the knee popliteal artery to below-the-knee popliteal artery bypass with ipsilateral non-reversed popliteal artery  1 Day Post-Op  Plan: -pt with patent bypass graft with easily palpable left DP/PT -DVT prophylaxis:  Lovenox to start this morning -transfer to 2W -mobilize today -anticipate d/c in next day or two   Doreatha MassedSamantha Rhyne, PA-C [facility] [phone] 11/17/2015 7:22 AM  Addendum  I have independently interviewed and examined the patient, and I agree with the physician assistant's findings.  Increased metformin to 500 mg PO BID.   Probably will eventually need to be on 1000 mg PO BID for uncontrolled diabetes with A1c 8.1.  Additional diagnoses: diabetes complicated with peripheral arterial disease  Leonides SakeBrian Yesenia Locurto, MD [facility] [place] Office: [phone] Pager: [phone]  11/17/2015, 8:38 AM

## 2015-11-17 NOTE — Progress Notes (Signed)
[facility] on  2 west. VSS. Transferred to [id number] via wheelchair with personal belongings.   [facility] Center[facility] Myers [place],RN

## 2015-11-17 NOTE — Care Management Note (Signed)
Case Management Note  Patient Details  Name: Douglas HarpinKeith A Francisco MRN: [id number] Date of Birth: 03/18/1961  Subjective/Objective:            Admitted with left large popliteal aneurysm with thrombus, s/p exclusion of left popliteal aneurysm and left above the knee popliteal artery to below-the-knee popliteal artery bypass with ipsilateral non-reversed popliteal artery. From home with wife. Independent with ADL's pta.  No DME usage. .  Action/Plan: Pending recommendations from PT's  evaluation, CM to f/u with disposition needs. Return to home when medically stable.   Expected Discharge Date:                  Expected Discharge Plan:  Home/Self Care  In-House Referral:     Discharge planning Services  CM Consult  Post Acute Care Choice:    Choice offered to:     DME Arranged:    DME Agency:     HH Arranged:    HH Agency:     Status of Service:  In process, will continue to follow  Medicare Important Message Given:    Date Medicare IM Given:    Medicare IM give by:    Date Additional Medicare IM Given:    Additional Medicare Important Message give by:     If discussed at Long Length of Stay Meetings, dates discussed:    Additional Comments:  Pt states plans to be d/c to home , assuming care for self with the help/assistance from wife if needed.  Gracelyn NurseSandra I Callow (Spouse)  [phone]  Gae GallopCole, Adriane Guglielmo HanoverHudson, ArizonaRN,BSN,CM [phone] 11/17/2015, 10:02 AM

## 2015-11-17 NOTE — Telephone Encounter (Signed)
-----   Message from Sharee PimpleMarilyn K McChesney, RN sent at 11/17/2015  9:03 AM EST ----- Regarding: schedule   ----- Message -----    From: Dara LordsSamantha J Rhyne, PA-C    Sent: 11/17/2015   7:29 AM      To: V[facility] Charge Pool  S/p left AK pop to BK pop 11/16/15.  F/u with Dr. Imogene Burnhen in 2 weeks.  Thanks, Lelon MastSamantha

## 2015-11-18 LAB — GLUCOSE, CAPILLARY: GLUCOSE-CAPILLARY: 148 mg/dL — AB (ref 65–99)

## 2015-11-18 NOTE — Discharge Summary (Signed)
Discharge Summary     Douglas Anthony [birth year] 54 y.o. male  [id number]  Admission Date: 11/16/2015  Discharge Date: 11/17/15  Physician: Fransisco Hertz, MD  Admission Diagnosis: Left popliteal artery aneurysm I72.4   HPI:   This is a 54 y.o. male who presents with chief complaint: left posterior calf/knee pain. The patient's symptoms have not progressed. The patient's symptoms are: aching pain behind knee extending into calf. The patient's treatment regimen currently included: maximal medical management. Pt returns after his CTA abd/pelvis with runoff.  Hospital Course:  The patient was admitted to the hospital and taken to the operating room on 11/16/2015 and underwent: 1. Exclusion of left popliteal aneurysm 2. Left above the knee popliteal artery to below-the-knee popliteal artery bypass with ipsilateral non-reversed popliteal artery    The pt tolerated the procedure well and was transported to the PACU in good condition.   By POD 1, he was doing well with easily palpable left DP/PT.  He was transferred to 2 west that day.   His Metformin was increased to  bid.  His HgbA1c was 8.1.  He was ambulating well and was discharged.   The remainder of the hospital course consisted of increasing mobilization and increasing intake of solids without difficulty.  CBC    Component Value Date/Time   WBC 12.0* 11/17/2015 0518   RBC 4.72 11/17/2015 0518   HGB 14.6 11/17/2015 0518   HCT 42.8 11/17/2015 0518   PLT 181 11/17/2015 0518   MCV 90.7 11/17/2015 0518   MCH 30.9 11/17/2015 0518   MCHC 34.1 11/17/2015 0518   RDW 12.0 11/17/2015 0518    BMET    Component Value Date/Time   NA 137 11/17/2015 0518   K 4.1 11/17/2015 0518   CL 102 11/17/2015 0518   CO2 27 11/17/2015 0518   GLUCOSE 151* 11/17/2015 0518   BUN 10 11/17/2015 0518   CREATININE 0.95 11/17/2015 0518   CALCIUM 8.7* 11/17/2015 0518   GFRNONAA >60 11/17/2015 0518   GFRAA >60 11/17/2015 0518      Discharge Instructions    Call MD for:  redness, tenderness, or signs of infection (pain, swelling, bleeding, redness, odor or green/yellow discharge around incision site)    Complete by:  As directed      Call MD for:  severe or increased pain, loss or decreased feeling  in affected limb(s)    Complete by:  As directed      Call MD for:  temperature >100.5    Complete by:  As directed      Discharge wound care:    Complete by:  As directed   Shower daily with soap and water starting 11/19/15     Driving Restrictions    Complete by:  As directed   No driving for 2 weeks     Lifting restrictions    Complete by:  As directed   No lifting for 4 weeks     Resume previous diet    Complete by:  As directed            Discharge Diagnosis:  Left popliteal artery aneurysm I72.4  Secondary Diagnosis: Patient Active Problem List   Diagnosis Date Noted  . 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 Knee pain, left 10/05/2015   Past Medical History  Diagnosis Date  . Hypertension   . Diabetes mellitus without complication (HCC)   . Blood clot in vein     left  leg; Tx Oral medication  . PONV (postoperative nausea and vomiting)   . Arthritis   . Hyperlipemia        Medication List    TAKE these medications        amLODipine 10 MG tablet  Commonly known as:  NORVASC  Take 10 mg by mouth daily.     aspirin 81 MG tablet  Take 81 mg by mouth daily.     atorvastatin 20 MG tablet  Commonly known as:  LIPITOR  Take 20 mg by mouth daily.     etodolac 500 MG tablet  Commonly known as:  LODINE  Take 500 mg by mouth daily.     LORazepam 1 MG tablet  Commonly known as:  ATIVAN  Take 1 mg by mouth every 8 (eight) hours as needed for anxiety. PRN     metFORMIN 500 MG tablet  Commonly known as:  GLUCOPHAGE  Take 1 tablet (500 mg total) by mouth 2 (two) times daily with a meal.     oxyCODONE 5 MG immediate release tablet   Commonly known as:  ROXICODONE  Take 1 tablet (5 mg total) by mouth every 6 (six) hours as needed.     quinapril 40 MG tablet  Commonly known as:  ACCUPRIL  Take 40 mg by mouth daily.        Prescriptions given: 1.  Roxicodone #30 No Refill 2. Metformin increased to  po bid  Instructions: 1.  Wash the groin wound with soap and water daily and pat dry. (No tub bath-only shower)  Then put a dry gauze or washcloth there to keep this area dry daily and as needed.  Do not use Vaseline or neosporin on your incisions.  Only use soap and water on your incisions and then protect and keep dry.  Disposition: home  Patient's condition: is Good  Follow up: 1. Dr. Imogene Burn in 2 weeks 2. Dewaine Oats in next 1-2 weeks for Management of diabetes/medication review. Metformin dose increased to twice daily during hospitalization. HgbA1c 8.1./ pt app is on 11/28/15 @ 2:45    Doreatha Massed, [place] [facility] [phone] 11/18/2015  7:53 AM  - For VQI Registry use --- Instructions: Press F2 to tab through selections.  Delete question if not applicable.   Post-op:  Wound infection: No  Graft infection: No  Transfusion: No  If yes, n/a units given New Arrhythmia: No Ipsilateral amputation: No,  Minor,  BKA,  AKA Discharge patency: [x ] Primary,  Primary assisted,  Secondary,  Occluded Patency judged by:  Dopper only,  Palpable graft pulse,  Palpable distal pulse,  ABI inc. > 0.15,  Duplex Discharge ABI: R not done, L not done D/C Ambulatory Status: Ambulatory  Complications: MI: No,  Troponin only,  EKG or Clinical CHF: No Resp failure:No,  Pneumonia,  Ventilator Chg in renal function: No,  Inc. Cr > 0.5,  Temp. Dialysis,  Permanent dialysis Stroke: No,  Minor,  Major Return to OR: No  Reason for return to OR:  Bleeding,  Infection,  Thrombosis,  Revision  Discharge medications: Statin use:   yes ASA use:  yes Plavix use:  no Beta blocker use: no ACEI use:   yes ARB use:  no Coumadin use: no    Addendum  I have independently interviewed and examined the patient, and I agree  with the physician assistant's discharge summary.  This patient underwent an uneventful L popliteal artery exclusion with L AK to BK pop bypass.  The below-the-knee popliteal artery was found to be somewhat diseased with calcific plaque consistent with new diabetes diagnosis.  I formally diagnosed him with uncontrolled diabetes with HgA1c of 8.1.  He must have been diagnosed with glucose intolerance previously as he was already on a minimal dose of Metformin.  I started increasing the dose to 500 mg PO BID.  He was referred back to his PCP for further DM mgmt.      Leonides SakeBrian Wyoma Genson, MD [facility] [place] Office: [phone] Pager: [phone]  11/22/2015, 9:13 AM

## 2015-11-30 ENCOUNTER — Encounter: Payer: Self-pay | Admitting: Vascular Surgery

## 2015-12-09 ENCOUNTER — Encounter: Payer: Self-pay | Admitting: Vascular Surgery

## 2015-12-09 ENCOUNTER — Ambulatory Visit (INDEPENDENT_AMBULATORY_CARE_PROVIDER_SITE_OTHER): Payer: Self-pay | Admitting: Vascular Surgery

## 2015-12-09 VITALS — BP 137/97 | HR 87 | Temp 98.7°F | Resp 16 | Ht 69.0 in | Wt 231.0 lb

## 2015-12-09 DIAGNOSIS — Z0279 Encounter for issue of other medical certificate: Secondary | ICD-10-CM

## 2015-12-09 DIAGNOSIS — I724 Aneurysm of artery of lower extremity: Secondary | ICD-10-CM

## 2015-12-09 NOTE — Progress Notes (Signed)
    Postoperative Visit   History of Present Illness  Douglas Anthony is a 54 y.o. year old male who presents for postoperative follow-up for: exclusion of L popliteal artery aneurysm, L pop-pop bypass with ips NR GSV (Date: 11/16/15).  The patient's wounds are healing.  The patient notes resolution of lower extremity symptoms.  The patient is able to complete their activities of daily living.  The patient's current symptoms are: limited amount of drainage from calf incision, some paraesthesia in anterior shin which are improving.  For VQI Use Only  PRE-ADM LIVING: Home  AMB STATUS: Ambulatory  Physical Examination  Filed Vitals:   12/09/15 1143 12/09/15 1145  BP: 136/96 137/97  Pulse: 87 87  Temp: 98.7 F (37.1 C)   Resp: 16    LLE: vein harvest incisions c/d/i, above-the-knee incision healing, below-the-knee incision healing with small defect ~3 mm in mid-segment, no drainage at this point, no erythema, edema 1+, palpable DP and PT  Medical Decision Making  Douglas Anthony is a 54 y.o. year old male who presents s/p L pop aneurysm exclusion, L pop-pop BPG w/ ips NR GSV   The patient's bypass incisions are healing appropriately with resolution of pre-operative symptoms.  Recheck incision in 2 weeks.  Re-evaluate for fitness for resuming work in 2 weeks.  Thank you for allowing us to participate in this patient's care.  Leonides SakeBrian Chen, MD [facility] [place] Office: [phone] Pager: [phone]  12/09/2015, 12:20 PM

## 2015-12-14 DIAGNOSIS — H903 Sensorineural hearing loss, bilateral: Secondary | ICD-10-CM | POA: Diagnosis not present

## 2015-12-14 DIAGNOSIS — H7013 Chronic mastoiditis, bilateral: Secondary | ICD-10-CM | POA: Diagnosis not present

## 2015-12-16 ENCOUNTER — Encounter: Payer: Self-pay | Admitting: Vascular Surgery

## 2015-12-23 ENCOUNTER — Encounter: Payer: Self-pay | Admitting: Vascular Surgery

## 2015-12-23 ENCOUNTER — Ambulatory Visit (INDEPENDENT_AMBULATORY_CARE_PROVIDER_SITE_OTHER): Payer: 59 | Admitting: Vascular Surgery

## 2015-12-23 VITALS — BP 123/85 | HR 89 | Ht 69.0 in | Wt 230.6 lb

## 2015-12-23 DIAGNOSIS — I724 Aneurysm of artery of lower extremity: Secondary | ICD-10-CM

## 2015-12-23 NOTE — Addendum Note (Signed)
Addended by: Adria DillELDRIDGE-LEWIS, Kiyona Mcnall L on: 12/23/2015 01:06 PM   Modules accepted: Orders

## 2015-12-23 NOTE — Progress Notes (Addendum)
    Postoperative Visit   History of Present Illness  Douglas Anthony is a 55 y.o. (10/22/1961) male who presents for postoperative follow-up for: exclusion of L popliteal artery aneurysm, L pop-pop bypass with ips NR GSV (Date: 11/16/15). The patient's wounds are nearly healed. The patient notes resolution of lower extremity symptoms. The patient is able to complete their activities of daily living. The patient's current symptoms are: recurrent suture spitting  For VQI Use Only  PRE-ADM LIVING: Home  AMB STATUS: Ambulatory  Physical Examination  Filed Vitals:   12/23/15 1103  BP: 123/85  Pulse: 89  Height: 5\' 9"  (1.753 m)  Weight: 230 lb 9.6 oz (104.599 kg)  SpO2: 96%   LLE: vein harvest incisions healed, vicryl spitting in multiple incision (sharply removed when possible), above-the-knee incision healed, below-the-knee incision healed, small scabs in incisions, no erythema, no edema, palpable DP and PT  Medical Decision Making  Douglas Anthony is a 55 y.o. (05/21/1961) male who presents s/p L pop aneurysm exclusion, L pop-pop BPG w/ ips NR GSV, R popliteal artery ectasia   The patient's bypass incisions are healing appropriately with resolution of pre-operative symptoms.  Pt seems to have problems absorbing Vicryl, so will have to avoid in the future.  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  Will scheduled: L leg arterial duplex, BLE ABI in 3 months for surveillance.  Will also need annual popliteal artery duplex to follow the R popliteal artery.  Thank you for allowing us to participate in this patient's care.  Leonides SakeBrian Chen, MD, FACS [facility] [place] Office: [phone] Pager: [phone]  12/23/2015, 11:25 AM

## 2016-01-12 DIAGNOSIS — H906 Mixed conductive and sensorineural hearing loss, bilateral: Secondary | ICD-10-CM | POA: Diagnosis not present

## 2016-01-27 DIAGNOSIS — H9193 Unspecified hearing loss, bilateral: Secondary | ICD-10-CM | POA: Diagnosis not present

## 2016-01-27 DIAGNOSIS — H6091 Unspecified otitis externa, right ear: Secondary | ICD-10-CM | POA: Diagnosis not present

## 2016-01-27 DIAGNOSIS — L980 Pyogenic granuloma: Secondary | ICD-10-CM | POA: Diagnosis not present

## 2016-01-28 DIAGNOSIS — H90A32 Mixed conductive and sensorineural hearing loss, unilateral, left ear with restricted hearing on the contralateral side: Secondary | ICD-10-CM | POA: Diagnosis not present

## 2016-01-31 ENCOUNTER — Other Ambulatory Visit: Payer: Self-pay

## 2016-01-31 NOTE — Patient Outreach (Signed)
[facility]) Care Management  01/31/2016  Douglas Anthony [birth year] [id number]    Phone call for high cost referral assessment Member states he has been back to work for over a month and he is doing well.  States that he is better than before his surgery.  States that he does check his blood sugars and they run from 120-150.  He is not in the [employer] to [employer]. Declines interest in program but states he will look at the flyer. Denies any needs at this time.  Instructed member on the [employer] and program flyer to be mailed to member. Member assessed with no further interventions needed. Dudley Major RN, [facility] Care Management Coordinator-Link to Wellness [facility] Care Management [phone]

## 2016-02-17 DIAGNOSIS — I10 Essential (primary) hypertension: Secondary | ICD-10-CM | POA: Diagnosis not present

## 2016-02-17 DIAGNOSIS — E119 Type 2 diabetes mellitus without complications: Secondary | ICD-10-CM | POA: Diagnosis not present

## 2016-02-17 DIAGNOSIS — E785 Hyperlipidemia, unspecified: Secondary | ICD-10-CM | POA: Diagnosis not present

## 2016-03-01 ENCOUNTER — Telehealth: Payer: Self-pay

## 2016-03-01 NOTE — Progress Notes (Signed)
    Postoperative Visit   History of Present Illness  Douglas Anthony is a [age] y.o. year old male who presents for postoperative follow-up for cc: rash left foot 1. Exclusion of left popliteal aneurysm 2. Left above the knee popliteal artery to below-the-knee popliteal artery bypass with ipsilateral non-reversed greater saphenous vein (Date: 11/16/15).  The patient's wounds have been healed.  The patient notes resolution of lower extremity symptoms.  The patient is able to complete their activities of daily living.  The patient's current symptoms are: itching and rashing in his lower leg distal to the bypass.  For VQI Use Only  PRE-ADM LIVING: Home  AMB STATUS: Ambulatory  Physical Examination  Filed Vitals:   03/02/16 0852  BP: 124/83  Pulse: 84   LLE: Incisions are healed, pedal pulses are palpable, maculopapular rash extended from distal medial ankle up to anterior shin, some skin breakdown, some possible bites at level of dorsum of foot, excoriations  Medical Decision Making  Douglas Anthony is a 55 y.o. year old male who presents s/p L pop aneurysm exclusion, L pop to pop bypass with ips NR GSV, ? contact dermatitis   This patient's rash is unrelated to his bypass.  I recommended he follow up with his PCP vs Dermatologist  He will follow up with me next month for surveillance studies.  Thank you for allowing us to participate in this patient's care.  Leonides SakeBrian Chen, MD [facility] [place] Office: [phone] Pager: [phone]  03/02/2016, 8:56 AM

## 2016-03-01 NOTE — Telephone Encounter (Addendum)
Phone call from pt.  Reported that he has had redness of left lower leg, from foot to knee for some time, and now over past couple of days, has red streaks going up the leg.  Denied any open sores or drainage.  Stated has "some swelling in left foot and ankle, that is not new."  Stated his skin is very dry and flaky, and itches.  Reported the itching has been present over past 2 weeks.  Denied fever/ chills.  Advised that Scheduler will contact him with appt. For tomorrow, 03/02/16.  Agreed.

## 2016-03-02 ENCOUNTER — Encounter: Payer: Self-pay | Admitting: Vascular Surgery

## 2016-03-02 ENCOUNTER — Ambulatory Visit (INDEPENDENT_AMBULATORY_CARE_PROVIDER_SITE_OTHER): Payer: 59 | Admitting: Vascular Surgery

## 2016-03-02 VITALS — BP 124/83 | HR 84 | Ht 69.0 in | Wt 231.9 lb

## 2016-03-02 DIAGNOSIS — I724 Aneurysm of artery of lower extremity: Secondary | ICD-10-CM | POA: Diagnosis not present

## 2016-03-16 DIAGNOSIS — L309 Dermatitis, unspecified: Secondary | ICD-10-CM | POA: Diagnosis not present

## 2016-03-16 DIAGNOSIS — I872 Venous insufficiency (chronic) (peripheral): Secondary | ICD-10-CM | POA: Diagnosis not present

## 2016-03-20 DIAGNOSIS — B360 Pityriasis versicolor: Secondary | ICD-10-CM | POA: Diagnosis not present

## 2016-03-21 ENCOUNTER — Encounter: Payer: Self-pay | Admitting: Vascular Surgery

## 2016-03-28 DIAGNOSIS — I10 Essential (primary) hypertension: Secondary | ICD-10-CM | POA: Diagnosis not present

## 2016-03-28 DIAGNOSIS — E785 Hyperlipidemia, unspecified: Secondary | ICD-10-CM | POA: Diagnosis not present

## 2016-03-28 DIAGNOSIS — E119 Type 2 diabetes mellitus without complications: Secondary | ICD-10-CM | POA: Diagnosis not present

## 2016-03-30 ENCOUNTER — Ambulatory Visit (HOSPITAL_COMMUNITY)
Admission: RE | Admit: 2016-03-30 | Discharge: 2016-03-30 | Disposition: A | Discharge status: Home / Self Care | Payer: 59 | Source: Ambulatory Visit | Attending: Vascular Surgery | Admitting: Vascular Surgery

## 2016-03-30 ENCOUNTER — Ambulatory Visit (INDEPENDENT_AMBULATORY_CARE_PROVIDER_SITE_OTHER)
Admission: RE | Admit: 2016-03-30 | Discharge: 2016-03-30 | Disposition: A | Discharge status: Home / Self Care | Payer: 59 | Source: Ambulatory Visit | Attending: Vascular Surgery | Admitting: Vascular Surgery

## 2016-03-30 ENCOUNTER — Ambulatory Visit (INDEPENDENT_AMBULATORY_CARE_PROVIDER_SITE_OTHER): Payer: 59 | Admitting: Vascular Surgery

## 2016-03-30 ENCOUNTER — Encounter: Payer: Self-pay | Admitting: Vascular Surgery

## 2016-03-30 VITALS — BP 110/74 | HR 82 | Temp 97.3°F | Resp 16 | Ht 69.0 in | Wt 227.0 lb

## 2016-03-30 DIAGNOSIS — E119 Type 2 diabetes mellitus without complications: Secondary | ICD-10-CM | POA: Diagnosis not present

## 2016-03-30 DIAGNOSIS — I724 Aneurysm of artery of lower extremity: Secondary | ICD-10-CM

## 2016-03-30 DIAGNOSIS — I10 Essential (primary) hypertension: Secondary | ICD-10-CM | POA: Insufficient documentation

## 2016-03-30 DIAGNOSIS — R0989 Other specified symptoms and signs involving the circulatory and respiratory systems: Secondary | ICD-10-CM | POA: Diagnosis present

## 2016-03-30 DIAGNOSIS — E785 Hyperlipidemia, unspecified: Secondary | ICD-10-CM | POA: Diagnosis not present

## 2016-03-30 NOTE — Progress Notes (Signed)
Established Previous Bypass   History of Present Illness  Douglas Anthony is a 55 y.o. (07/21/1961) male who presents with chief complaint: no pain.  Previous operation(s) include:   11/16/15: 1. Exclusion of left popliteal aneurysm 2. Left above the knee popliteal artery to below-the-knee popliteal artery bypass with ipsilateral non-reversed greater saphenous vein  The patient's symptoms have resolved.  The patient's symptoms are: intermittent sharp pain in vein harvest incisions. The patient's treatment regimen currently included: maximal medical management.  The patient's PMH, PSH, SH, and FamHx are unchanged from 03/01/16.  Current Outpatient Prescriptions  Medication Sig Dispense Refill  . amLODipine (NORVASC) 10 MG tablet Take 10 mg by mouth daily.     Marland Kitchen. aspirin 81 MG tablet Take 81 mg by mouth daily.    Marland Kitchen. atorvastatin (LIPITOR) 20 MG tablet Take 20 mg by mouth daily.     Marland Kitchen. etodolac (LODINE) 500 MG tablet Take 500 mg by mouth daily.     Marland Kitchen. HYDROcodone-acetaminophen (NORCO/VICODIN) 5-325 MG tablet Reported on 12/23/2015    . LORazepam (ATIVAN) 1 MG tablet Take 1 mg by mouth every 8 (eight) hours as needed for anxiety. PRN  3  . metFORMIN (GLUCOPHAGE) 500 MG tablet Take 1 tablet (500 mg total) by mouth 2 (two) times daily with a meal.    . oxyCODONE (ROXICODONE) 5 MG immediate release tablet Take 1 tablet (5 mg total) by mouth every 6 (six) hours as needed. (Patient not taking: Reported on 12/23/2015) 30 tablet 0  . quinapril (ACCUPRIL) 40 MG tablet Take 40 mg by mouth daily.    . TRUE METRIX BLOOD GLUCOSE TEST test strip     . TRUEPLUS LANCETS 30G MISC      No current facility-administered medications for this visit.    No Known Allergies  On ROS today: no intermittent claudication , resolution of left knee pain   Physical Examination  Filed Vitals:   03/30/16 1355  BP: 110/74  Pulse: 82  Temp: 97.3 F (36.3 C)  TempSrc: Oral  Resp: 16  Height: 5\' 9"  (1.753 m)    Weight: 227 lb (102.967 kg)  SpO2: 95%   Body mass index is 33.51 kg/(m^2).  General: A&O x 3, WDWN  Pulmonary: Sym exp, good air movt, CTAB, no rales, rhonchi, & wheezing  Cardiac: RRR, Nl S1, S2, no Murmurs, rubs or gallops  Vascular: Vessel Right Left  Radial Palpable Palpable  Brachial Palpable Palpable  Carotid Palpable, without bruit Palpable, without bruit  Aorta Not palpable N/A  Femoral Palpable Palpable  Popliteal Not palpable Not palpable  PT Palpable Palpable  DP Palpable Palpable   Gastrointestinal: soft, NTND, no G/R, no HSM, no masses, no CVAT B  Musculoskeletal: M/S 5/5 throughout , Extremities without ischemic changes , incisions are healed, wounds are healed  Neurologic: Pain and light touch intact in extremities , Motor exam as listed above   Non-Invasive Vascular Imaging ABI (Date: 03/30/2016)  R:   ABI: 1.22,   DP: tri,   PT: tri,   TBI: 0.93  L:   ABI: 1.15,   DP: tri,   PT: tri,   TBI: 0.89   Bypass Duplex (Date: 03/30/2016)  Proximal to bypass: 91 c/s  With-in bypass: 61-90 c/s  Distal to bypass: 73 c/s  Widely patent bypass  No flow in L pop artery sac   Medical Decision Making  Douglas Anthony is a 55 y.o. male who presents with: s/p L pop aneurysm exclusion, L  pop-pop bypass  .  Based on the patient's vascular studies and examination, I have offered the patient: BLE ABI and  bypass duplex in 6 months  I discussed in depth with the patient the nature of atherosclerosis, and emphasized the importance of maximal medical management including strict control of blood pressure, blood glucose, and lipid levels, obtaining regular exercise, and cessation of smoking.    The patient is aware that without maximal medical management the underlying atherosclerotic disease process will progress, limiting the benefit of any interventions.  I discussed in depth with the patient the need for long term surveillance to improve the  primary assisted patency of his bypass.  The patient agrees to cooperate with such. The patient is currently on a statin: Lipitor.  The patient is currently on an anti-platelet: ASA.  Thank you for allowing [place] to participate in this patient's care.   Leonides Sake, MD [facility] [place] Office: [phone] Pager: [phone]  03/30/2016, 2:31 PM

## 2016-05-04 NOTE — Addendum Note (Signed)
Addended by: Lorin MercyMCCHESNEY, MARILYN K on: 05/04/2016 02:00 PM   Modules accepted: Orders

## 2016-06-13 DIAGNOSIS — H7013 Chronic mastoiditis, bilateral: Secondary | ICD-10-CM | POA: Diagnosis not present

## 2016-06-13 DIAGNOSIS — H903 Sensorineural hearing loss, bilateral: Secondary | ICD-10-CM | POA: Diagnosis not present

## 2016-08-03 DIAGNOSIS — E119 Type 2 diabetes mellitus without complications: Secondary | ICD-10-CM | POA: Diagnosis not present

## 2016-08-03 DIAGNOSIS — E785 Hyperlipidemia, unspecified: Secondary | ICD-10-CM | POA: Diagnosis not present

## 2016-08-03 DIAGNOSIS — I10 Essential (primary) hypertension: Secondary | ICD-10-CM | POA: Diagnosis not present

## 2016-08-08 DIAGNOSIS — E785 Hyperlipidemia, unspecified: Secondary | ICD-10-CM | POA: Diagnosis not present

## 2016-08-08 DIAGNOSIS — E119 Type 2 diabetes mellitus without complications: Secondary | ICD-10-CM | POA: Diagnosis not present

## 2016-10-05 ENCOUNTER — Encounter (HOSPITAL_COMMUNITY): Payer: 59

## 2016-10-05 ENCOUNTER — Ambulatory Visit: Payer: 59 | Admitting: Vascular Surgery

## 2016-10-12 ENCOUNTER — Encounter (HOSPITAL_COMMUNITY): Payer: 59

## 2016-10-12 ENCOUNTER — Ambulatory Visit: Payer: 59 | Admitting: Vascular Surgery

## 2016-10-31 ENCOUNTER — Ambulatory Visit (HOSPITAL_COMMUNITY)
Admission: RE | Admit: 2016-10-31 | Discharge: 2016-10-31 | Disposition: A | Discharge status: Home / Self Care | Payer: 59 | Source: Ambulatory Visit | Attending: Vascular Surgery | Admitting: Vascular Surgery

## 2016-10-31 ENCOUNTER — Other Ambulatory Visit: Payer: Self-pay | Admitting: Vascular Surgery

## 2016-10-31 DIAGNOSIS — E119 Type 2 diabetes mellitus without complications: Secondary | ICD-10-CM | POA: Insufficient documentation

## 2016-10-31 DIAGNOSIS — Z720 Tobacco use: Secondary | ICD-10-CM | POA: Insufficient documentation

## 2016-10-31 DIAGNOSIS — I10 Essential (primary) hypertension: Secondary | ICD-10-CM | POA: Insufficient documentation

## 2016-10-31 DIAGNOSIS — I724 Aneurysm of artery of lower extremity: Secondary | ICD-10-CM

## 2016-10-31 DIAGNOSIS — E785 Hyperlipidemia, unspecified: Secondary | ICD-10-CM | POA: Diagnosis not present

## 2016-10-31 DIAGNOSIS — R0989 Other specified symptoms and signs involving the circulatory and respiratory systems: Secondary | ICD-10-CM | POA: Diagnosis present

## 2016-11-07 ENCOUNTER — Encounter: Payer: Self-pay | Admitting: Vascular Surgery

## 2016-11-08 NOTE — Progress Notes (Signed)
Established Previous Bypass   History of Present Illness  Douglas Anthony is a 55 y.o. (1961/02/28) male who presents with chief complaint: follow-up.  Previous operation(s) include:  11/16/15 Dx: L popliteal aneurysm 1. Exclusion of left popliteal aneurysm 2. Left above the knee popliteal artery to below-the-knee popliteal artery bypass with ipsilateral non-reversed greater saphenous vein  The patient denies any claudication, paraesthesias or rest pain. The patient's treatment regimen currently included: maximal medical management. Pt has known R pop ectasia.  The patient's PMH, PSH, SH, and FamHx are unchanged from 03/30/16. He works as the Runner, broadcasting/film/video at [employer]. He continues to smoke but is trying to quit.   Current Outpatient Prescriptions  Medication Sig Dispense Refill  . amLODipine (NORVASC) 10 MG tablet Take 10 mg by mouth daily.     Marland Kitchen aspirin 81 MG tablet Take 81 mg by mouth daily.    Marland Kitchen atorvastatin (LIPITOR) 20 MG tablet Take 20 mg by mouth daily.     Marland Kitchen etodolac (LODINE) 500 MG tablet Take 500 mg by mouth daily.     Marland Kitchen LORazepam (ATIVAN) 1 MG tablet Take 1 mg by mouth every 8 (eight) hours as needed for anxiety. PRN  3  . metFORMIN (GLUCOPHAGE) 500 MG tablet Take 1 tablet (500 mg total) by mouth 2 (two) times daily with a meal. (Patient taking differently: Take 500 mg by mouth. 2 tablets in the morning and 2 at night)    . quinapril (ACCUPRIL) 40 MG tablet Take 40 mg by mouth daily.    . TRUE METRIX BLOOD GLUCOSE TEST test strip     . TRUEPLUS LANCETS 30G MISC      No current facility-administered medications for this visit.     No Known Allergies  On ROS today: he denies any rest pain, claudication or numbness of his feet   Physical Examination  Vitals:   11/09/16 1407  BP: 126/82  Pulse: 82  Resp: 16  Temp: 97.7 F (36.5 C)  SpO2: 95%  Weight: 215 lb (97.5 kg)  Height: 5\' 9"  (1.753 m)    Body mass index is 31.75  kg/m.  General: Alert, O x 3, WD,NAD  Pulmonary: Sym exp, good B air movt,CTA B  Cardiac: RRR, Nl S1, S2, no Murmurs, No rubs, No S3,S4  Vascular: Vessel Right Left  Radial Palpable Palpable  Carotid Palpable, No Bruit Palpable, No Bruit  Popliteal Not palpable Not palpable  PT Palpable Palpable  DP Palpable Palpable    Musculoskeletal: M/S 5/5 throughout  , Extremities without ischemic changes  , No edema present,   Neurologic: CN 2-12 intact , Pain and light touch intact in extremities , Motor exam as listed above   Non-Invasive Vascular Imaging ABI (Date: 10/31/2016)  R:   ABI: 1.2 (PT),   DP: tri  PT: tri  TBI:  0.78  L:   ABI: 1.19 (PT),   DP: tri  PT: tri  TBI: 0.98  Bypass Duplex (Date: 11/09/2016)  Proximal to bypass: 69 c/s  With-in bypass: 68 c/s  Distal to bypass: 67 c/s   Medical Decision Making  Douglas Anthony is a 55 y.o. male who presents with: s/p L pop aneurysm exclusion and L AK to BK pop BPG w/ ips NR GSV, R pop ectasia   Based on the patient's vascular studies and examination, I have offered the patient: BLE ABI, LLE bypass duplex, and RLE arterial duplex in 6 months.  I discussed in  depth with the patient the nature of atherosclerosis, and emphasized the importance of maximal medical management including strict control of blood pressure, blood glucose, and lipid levels, obtaining regular exercise, and cessation of smoking.    The patient is aware that without maximal medical management the underlying atherosclerotic disease process will progress, limiting the benefit of any interventions.  I discussed in depth with the patient the need for long term surveillance to improve the primary assisted patency of his bypass.  The patient agrees to cooperate with such. The patient is currently on a statin:  Lipitor. The patient is currently on an anti-platelet: ASA.  Thank you for allowing us to participate in this patient's  care.   Maris BergerKimberly Trinh, PA-C [facility]   Addendum  I have independently interviewed and examined the patient, and I agree with the physician assistant's findings.  Will get R pop duplex next visit to follow R pop ectasia.  L pop exclusion and bypass looks good.    Leonides SakeBrian Chen, MD, FACS [facility] [place] Office: [phone] Pager: [phone]  11/09/2016, 2:46 PM  VASCULAR QUALITY INITIATIVE FOLLOW UP DATA: Current smoker: [ x ] yes  [  ] no  Living status: [x  ]  Home  [  ] Nursing home  [  ] Homeless    MEDS:  ASA [ x ] yes  [  ] no- [  ] medical reason  [  ] non compliant  STATIN  [  x] yes  [  ] no- [  ] medical reason  [  ] non compliant  Beta blocker [  ] yes  [ x ] no- [  ] medical reason  [  ] non compliant  ACE inhibitor [ x ] yes  [  ] no- [  ] medical reason  [  ] non compliant  P2Y12 Antagonist [ x ] none  [  ] clopidogrel-Plavix  [  ] ticlopidine-Ticlid   [  ] prasugrel-Effient  [  ] ticagrelor- Brilinta    Anticoagulant [x  ] None  [  ] warfarin  [  ] rivaroxaban-Xarelto [  ] dabigatran- Pradaxa  Ambulation: [ x ] Amb  [  ] Amb with assistance  [  ] wheelchair  [  ] bedridden  Ipsilateral Sx: [ x ] none   [  ] claudication  [  ] rest pain  [  ] tissue loss  Current Patency: [x  ] primary  [  ] primary-assisted  [  ] secondary  [  ] occluded  Patency judged by: [  ] doppler  [ x ] palp graft pulse  [ x ] palp distal pulse   [  ] ABI increase > 15%   [  ] Duplex  If occluded, when-   Ipsilateral ABI: 1.19  Ipsilateral TBI: 0.98  Infection: [ x ] none  [  ] cellulitis  [  ] deep abscess  [  ] infection of artery or graft  Bypass revision: [ x ] no  [  ] yes- [  ] surg  [  ] catheter based  [  ] both    Date:   Thrombectomy/ lysis/ revision:  [ x ] no  [  ] yes- [  ] surg  [  ] catheter based  [  ] both    Date:   Major amputation: [ x ] no  [  ]  minor amp  [  ] BKA  [  ] AKA   Date:

## 2016-11-09 ENCOUNTER — Encounter: Payer: Self-pay | Admitting: Vascular Surgery

## 2016-11-09 ENCOUNTER — Ambulatory Visit (INDEPENDENT_AMBULATORY_CARE_PROVIDER_SITE_OTHER): Payer: 59 | Admitting: Vascular Surgery

## 2016-11-09 VITALS — BP 126/82 | HR 82 | Temp 97.7°F | Resp 16 | Ht 69.0 in | Wt 215.0 lb

## 2016-11-09 DIAGNOSIS — I724 Aneurysm of artery of lower extremity: Secondary | ICD-10-CM

## 2016-12-28 DIAGNOSIS — E119 Type 2 diabetes mellitus without complications: Secondary | ICD-10-CM | POA: Diagnosis not present

## 2016-12-28 DIAGNOSIS — I10 Essential (primary) hypertension: Secondary | ICD-10-CM | POA: Diagnosis not present

## 2016-12-28 DIAGNOSIS — E785 Hyperlipidemia, unspecified: Secondary | ICD-10-CM | POA: Diagnosis not present

## 2017-01-02 DIAGNOSIS — E119 Type 2 diabetes mellitus without complications: Secondary | ICD-10-CM | POA: Diagnosis not present

## 2017-01-02 DIAGNOSIS — Z6831 Body mass index (BMI) 31.0-31.9, adult: Secondary | ICD-10-CM | POA: Diagnosis not present

## 2017-01-02 DIAGNOSIS — E785 Hyperlipidemia, unspecified: Secondary | ICD-10-CM | POA: Diagnosis not present

## 2017-01-02 DIAGNOSIS — I10 Essential (primary) hypertension: Secondary | ICD-10-CM | POA: Diagnosis not present

## 2017-01-06 DIAGNOSIS — M5440 Lumbago with sciatica, unspecified side: Secondary | ICD-10-CM | POA: Diagnosis not present

## 2017-03-27 ENCOUNTER — Encounter: Payer: Self-pay | Admitting: Cardiology

## 2017-05-01 ENCOUNTER — Encounter: Payer: Self-pay | Admitting: Vascular Surgery

## 2017-05-07 NOTE — Progress Notes (Signed)
Established Previous Bypass   History of Present Illness  Douglas Anthony is a 56 y.o. (05/27/1961) male  who presents with chief complaint:  Mild calf cramping.  Previous operation(s) include:   11/16/15  Dx: L popliteal aneurysm  1. Exclusion of left popliteal aneurysm 2. Left above the knee popliteal artery to below-the-knee popliteal artery bypass with ipsilateral non-reversed greater saphenous vein  The patient denies any claudication, paraesthesias or rest pain. The patient's treatment regimen currently included: maximal medical management. Pt has known R pop ectasia.  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 He continues to smoke but is trying to quit.   The patient's PMH, PSH, SH, and FamHx are unchanged from 11/09/16  Current Outpatient Prescriptions  Medication Sig Dispense Refill  . amLODipine (NORVASC) 10 MG tablet Take 10 mg by mouth daily.     Marland Kitchen. aspirin 81 MG tablet Take 81 mg by mouth daily.    Marland Kitchen. atorvastatin (LIPITOR) 20 MG tablet Take 20 mg by mouth daily.     Marland Kitchen. etodolac (LODINE) 500 MG tablet Take 500 mg by mouth daily.     Marland Kitchen. LORazepam (ATIVAN) 1 MG tablet Take 1 mg by mouth every 8 (eight) hours as needed for anxiety. PRN  3  . metFORMIN (GLUCOPHAGE) 500 MG tablet Take 1 tablet (500 mg total) by mouth 2 (two) times daily with a meal. (Patient taking differently: Take 500 mg by mouth. 2 tablets in the morning and 2 at night)    . quinapril (ACCUPRIL) 40 MG tablet Take 40 mg by mouth daily.    . TRUE METRIX BLOOD GLUCOSE TEST test strip     . TRUEPLUS LANCETS 30G MISC      No current facility-administered medications for this visit.    On ROS today: mild calf cramping with exercise, no embolic sx    Physical Examination   Vitals:   05/10/17 1312 05/10/17 1315  BP: (!) 130/93 126/87  Pulse: 91   Resp: 18   Temp: 97.8 F (36.6 C)   TempSrc: Oral   SpO2: 95%   Weight: 217 lb (98.4 kg)   Height: 5\' 9"  (1.753 m)     Body mass index is 32.05 kg/m.  General Alert, O x 3, WD, NAD  Pulmonary Sym exp, good B air movt, CTA B  Cardiac RRR, Nl S1, S2, no Murmurs, No rubs, No S3,S4  Vascular Vessel Right Left  Radial Palpable Palpable  Brachial Palpable Palpable  Carotid Palpable, No Bruit Palpable, No Bruit  Aorta Not palpable N/A  Femoral Palpable Palpable  Popliteal Not palpable Not palpable  PT Palpable Palpable  DP Palpable Palpable    Gastrointestinal soft, non-distended, non-tender to palpation, No guarding or rebound, no HSM, no masses, no CVAT B, No palpable prominent aortic pulse,    Musculoskeletal M/S 5/5 throughout  , Extremities without ischemic changes  , No edema present, No obvious varicosities , No Lipodermatosclerosis present  Neurologic Pain and light touch intact in extremities , Motor exam as listed above    Non-Invasive Vascular Imaging ABI (05/10/2017)  R:   ABI: 1.36 (1.2),   PT: tri  DP: tri  TBI:  0.90  L:   ABI: 1.26 (1.19),   PT: tri  DP: tri  TBI: 0.95  LLE Bypass Duplex (05/10/2017 )  Widely patent bypass  R pop arterial duplex (05/10/2017)  1.2 cm x 1.25 cm   Medical Decision Making  Douglas Anthony is  a 56 y.o. (02/01/1961) male who presents with: s/p L pop aneurysm exclusion and L AK to BK pop BPG w/ ips NR GSV, R pop ectasia   Based on the patient's vascular studies and examination, I have offered the patient: BLE ABI, LLE bypass duplex, and RLE arterial duplex in 12 months.  I discussed in depth with the patient the nature of atherosclerosis, and emphasized the importance of maximal medical management including strict control of blood pressure, blood glucose, and lipid levels, obtaining regular exercise, and cessation of smoking.    The patient is aware that without maximal medical management the underlying atherosclerotic disease process will progress, limiting the benefit of any interventions.  I discussed in depth with the patient  the need for long term surveillance to improve the primary assisted patency of his bypass.  The patient agrees to cooperate with such.  The patient is currently on a statin:  Lipitor.  The patient is currently on an anti-platelet: ASA.  Thank you for allowing [place] to participate in this patient's care.   Leonides Sake, MD, FACS [facility] [place] [facility] Office: [phone] Pager: [phone]

## 2017-05-10 ENCOUNTER — Ambulatory Visit (INDEPENDENT_AMBULATORY_CARE_PROVIDER_SITE_OTHER)
Admission: RE | Admit: 2017-05-10 | Discharge: 2017-05-10 | Disposition: A | Discharge status: Home / Self Care | Payer: 59 | Source: Ambulatory Visit | Attending: Vascular Surgery | Admitting: Vascular Surgery

## 2017-05-10 ENCOUNTER — Encounter: Payer: Self-pay | Admitting: Vascular Surgery

## 2017-05-10 ENCOUNTER — Ambulatory Visit (INDEPENDENT_AMBULATORY_CARE_PROVIDER_SITE_OTHER): Payer: 59 | Admitting: Vascular Surgery

## 2017-05-10 ENCOUNTER — Ambulatory Visit (HOSPITAL_COMMUNITY)
Admission: RE | Admit: 2017-05-10 | Discharge: 2017-05-10 | Disposition: A | Discharge status: Home / Self Care | Payer: 59 | Source: Ambulatory Visit | Attending: Vascular Surgery | Admitting: Vascular Surgery

## 2017-05-10 VITALS — BP 126/87 | HR 91 | Temp 97.8°F | Resp 18 | Ht 69.0 in | Wt 217.0 lb

## 2017-05-10 DIAGNOSIS — I10 Essential (primary) hypertension: Secondary | ICD-10-CM | POA: Diagnosis not present

## 2017-05-10 DIAGNOSIS — I724 Aneurysm of artery of lower extremity: Secondary | ICD-10-CM

## 2017-05-10 DIAGNOSIS — E119 Type 2 diabetes mellitus without complications: Secondary | ICD-10-CM | POA: Diagnosis not present

## 2017-05-10 DIAGNOSIS — R0989 Other specified symptoms and signs involving the circulatory and respiratory systems: Secondary | ICD-10-CM | POA: Diagnosis present

## 2017-05-10 DIAGNOSIS — E785 Hyperlipidemia, unspecified: Secondary | ICD-10-CM | POA: Diagnosis not present

## 2017-05-20 NOTE — Addendum Note (Signed)
Addended by: Burton ApleyPETTY, Santresa Levett A on: 05/20/2017 04:17 PM   Modules accepted: Orders

## 2017-05-24 ENCOUNTER — Encounter: Payer: Self-pay | Admitting: Cardiology

## 2017-07-05 DIAGNOSIS — I10 Essential (primary) hypertension: Secondary | ICD-10-CM | POA: Diagnosis not present

## 2017-07-05 DIAGNOSIS — E785 Hyperlipidemia, unspecified: Secondary | ICD-10-CM | POA: Diagnosis not present

## 2017-07-05 DIAGNOSIS — E119 Type 2 diabetes mellitus without complications: Secondary | ICD-10-CM | POA: Diagnosis not present

## 2017-07-10 DIAGNOSIS — E785 Hyperlipidemia, unspecified: Secondary | ICD-10-CM | POA: Diagnosis not present

## 2017-07-10 DIAGNOSIS — E119 Type 2 diabetes mellitus without complications: Secondary | ICD-10-CM | POA: Diagnosis not present

## 2017-07-10 DIAGNOSIS — I10 Essential (primary) hypertension: Secondary | ICD-10-CM | POA: Diagnosis not present

## 2018-01-15 DIAGNOSIS — I10 Essential (primary) hypertension: Secondary | ICD-10-CM | POA: Diagnosis not present

## 2018-01-15 DIAGNOSIS — E785 Hyperlipidemia, unspecified: Secondary | ICD-10-CM | POA: Diagnosis not present

## 2018-01-15 DIAGNOSIS — E119 Type 2 diabetes mellitus without complications: Secondary | ICD-10-CM | POA: Diagnosis not present

## 2018-01-22 DIAGNOSIS — I10 Essential (primary) hypertension: Secondary | ICD-10-CM | POA: Diagnosis not present

## 2018-01-22 DIAGNOSIS — E785 Hyperlipidemia, unspecified: Secondary | ICD-10-CM | POA: Diagnosis not present

## 2018-01-22 DIAGNOSIS — E119 Type 2 diabetes mellitus without complications: Secondary | ICD-10-CM | POA: Diagnosis not present

## 2018-05-16 ENCOUNTER — Ambulatory Visit: Payer: 59 | Admitting: Vascular Surgery

## 2018-05-16 ENCOUNTER — Encounter (HOSPITAL_COMMUNITY): Payer: 59

## 2018-05-20 NOTE — Progress Notes (Signed)
Established Previous Bypass   History of Present Illness   Douglas Anthony is a 57 y.o. (01/08/1961) male who presents with chief complaint: left leg swelling.    Prior procedures include: 1. Exclusion of L pop aneurysm, L pop BPG w/ ips NR GSV (11/16/15   The patient's symptoms have not progressed.  The patient's symptoms are: left leg swelling. The patient's treatment regimen currently included: maximal medical management.  The patient's PMH, PSH, SH, and FamHx were reviewed on 05/1218 are unchanged from 05/10/17.  Current Outpatient Medications  Medication Sig Dispense Refill  . amLODipine (NORVASC) 10 MG tablet Take 10 mg by mouth daily.     Marland Kitchen. aspirin 81 MG tablet Take 81 mg by mouth daily.    Marland Kitchen. atorvastatin (LIPITOR) 20 MG tablet Take 20 mg by mouth daily.     Marland Kitchen. etodolac (LODINE) 500 MG tablet Take 500 mg by mouth daily.     Marland Kitchen. LORazepam (ATIVAN) 1 MG tablet Take 1 mg by mouth every 8 (eight) hours as needed for anxiety. PRN  3  . metFORMIN (GLUCOPHAGE) 500 MG tablet Take 1 tablet (500 mg total) by mouth 2 (two) times daily with a meal. (Patient taking differently: Take 500 mg by mouth. 2 tablets in the morning and 2 at night)    . quinapril (ACCUPRIL) 40 MG tablet Take 40 mg by mouth daily.    . TRUE METRIX BLOOD GLUCOSE TEST test strip     . TRUEPLUS LANCETS 30G MISC      No current facility-administered medications for this visit.     On ROS today: chronic left leg swelling, L knee OA sx   Physical Examination   Vitals:   05/21/18 1410  BP: (!) 133/92  Pulse: 74  Temp: (!) 97.5 F (36.4 C)  SpO2: 96%  Weight: 209 lb (94.8 kg)  Height: 5\' 8"  (1.727 m)   Body mass index is 31.78 kg/m.  General Alert, O x 3, WD, NAD  Pulmonary Sym exp, good B air movt, CTA B  Cardiac RRR, Nl S1, S2, no Murmurs, No rubs, No S3,S4  Vascular Vessel Right Left  Radial Palpable Palpable  Brachial Palpable Palpable  Carotid Palpable, No Bruit Palpable, No Bruit  Aorta Not palpable  N/A  Femoral Palpable Palpable  Popliteal Not palpable Not palpable  PT Palpable Palpable  DP Palpable Palpable    Gastro- intestinal soft, non-distended, non-tender to palpation, No guarding or rebound, no HSM, no masses, no CVAT B, No palpable prominent aortic pulse,    Musculo- skeletal M/S 5/5 throughout  , Extremities without ischemic changes  , No edema present, No visible varicosities , No Lipodermatosclerosis present  Neurologic Pain and light touch intact in extremities , Motor exam as listed above    Non-invasive Vascular Imaging   ABI (05/21/2018)  R:   ABI: 1.32 (1.36),   PT: tri  DP: tri  TBI:  0.82  L:   ABI: 1.27 (1.26),   PT: tri  DP: tri  TBI: 0.95  L Bypass Duplex (05/21/2018)  Widely patent pop-pop BPG  RLE Arterial duplex (05/21/2018)  0.97 cm x 1.39 cm   Medical Decision Making   Douglas Anthony is a [age] y.o. male who presents with: s/p L pop aneurysm exclusion, L pop-pop BPG w/ ips NR GSV, R pop ectasia .   Based on the patient's vascular studies and examination, I have offered the patient: BLE ABI, bypass duplex, and R pop duplex in  12 months.  I discussed in depth with the patient the nature of atherosclerosis, and emphasized the importance of maximal medical management including strict control of blood pressure, blood glucose, and lipid levels, obtaining regular exercise, and cessation of smoking.    The patient is aware that without maximal medical management the underlying atherosclerotic disease process will progress, limiting the benefit of any interventions.  I discussed in depth with the patient the need for long term surveillance to improve the primary assisted patency of his bypass.  The patient agrees to cooperate with such.  The patient is currently on a statin: Lipitor.   The patient is currently on an anti-platelet: ASA.  Thank you for allowing [place] to participate in this patient's care.   Leonides Sake, MD, FACS [facility] [place] Office: [phone] Pager: [phone]

## 2018-05-21 ENCOUNTER — Encounter: Payer: Self-pay | Admitting: Vascular Surgery

## 2018-05-21 ENCOUNTER — Ambulatory Visit (INDEPENDENT_AMBULATORY_CARE_PROVIDER_SITE_OTHER)
Admission: RE | Admit: 2018-05-21 | Discharge: 2018-05-21 | Disposition: A | Discharge status: Home / Self Care | Payer: 59 | Source: Ambulatory Visit | Attending: Vascular Surgery | Admitting: Vascular Surgery

## 2018-05-21 ENCOUNTER — Ambulatory Visit (HOSPITAL_COMMUNITY)
Admission: RE | Admit: 2018-05-21 | Discharge: 2018-05-21 | Disposition: A | Discharge status: Home / Self Care | Payer: 59 | Source: Ambulatory Visit | Attending: Vascular Surgery | Admitting: Vascular Surgery

## 2018-05-21 ENCOUNTER — Ambulatory Visit: Payer: 59 | Admitting: Vascular Surgery

## 2018-05-21 VITALS — BP 133/92 | HR 74 | Temp 97.5°F | Ht 68.0 in | Wt 209.0 lb

## 2018-05-21 DIAGNOSIS — E785 Hyperlipidemia, unspecified: Secondary | ICD-10-CM | POA: Diagnosis not present

## 2018-05-21 DIAGNOSIS — Z95828 Presence of other vascular implants and grafts: Secondary | ICD-10-CM | POA: Diagnosis not present

## 2018-05-21 DIAGNOSIS — I10 Essential (primary) hypertension: Secondary | ICD-10-CM | POA: Insufficient documentation

## 2018-05-21 DIAGNOSIS — E119 Type 2 diabetes mellitus without complications: Secondary | ICD-10-CM | POA: Diagnosis not present

## 2018-05-21 DIAGNOSIS — I724 Aneurysm of artery of lower extremity: Secondary | ICD-10-CM | POA: Insufficient documentation

## 2018-05-21 DIAGNOSIS — F17200 Nicotine dependence, unspecified, uncomplicated: Secondary | ICD-10-CM | POA: Diagnosis not present

## 2018-07-10 DIAGNOSIS — R21 Rash and other nonspecific skin eruption: Secondary | ICD-10-CM | POA: Diagnosis not present

## 2018-07-16 DIAGNOSIS — I10 Essential (primary) hypertension: Secondary | ICD-10-CM | POA: Diagnosis not present

## 2018-07-16 DIAGNOSIS — E785 Hyperlipidemia, unspecified: Secondary | ICD-10-CM | POA: Diagnosis not present

## 2018-07-23 DIAGNOSIS — E119 Type 2 diabetes mellitus without complications: Secondary | ICD-10-CM | POA: Diagnosis not present

## 2018-07-23 DIAGNOSIS — E785 Hyperlipidemia, unspecified: Secondary | ICD-10-CM | POA: Diagnosis not present

## 2018-07-23 DIAGNOSIS — I10 Essential (primary) hypertension: Secondary | ICD-10-CM | POA: Diagnosis not present

## 2018-11-25 DIAGNOSIS — H524 Presbyopia: Secondary | ICD-10-CM | POA: Diagnosis not present

## 2018-12-11 DIAGNOSIS — J069 Acute upper respiratory infection, unspecified: Secondary | ICD-10-CM | POA: Diagnosis not present

## 2018-12-19 DIAGNOSIS — R05 Cough: Secondary | ICD-10-CM | POA: Diagnosis not present

## 2018-12-31 DIAGNOSIS — Z125 Encounter for screening for malignant neoplasm of prostate: Secondary | ICD-10-CM | POA: Diagnosis not present

## 2018-12-31 DIAGNOSIS — E785 Hyperlipidemia, unspecified: Secondary | ICD-10-CM | POA: Diagnosis not present

## 2018-12-31 DIAGNOSIS — I10 Essential (primary) hypertension: Secondary | ICD-10-CM | POA: Diagnosis not present

## 2018-12-31 DIAGNOSIS — E119 Type 2 diabetes mellitus without complications: Secondary | ICD-10-CM | POA: Diagnosis not present

## 2019-01-01 ENCOUNTER — Ambulatory Visit (HOSPITAL_COMMUNITY)
Admission: EM | Admit: 2019-01-01 | Discharge: 2019-01-01 | Disposition: A | Discharge status: Home / Self Care | Payer: 59

## 2019-01-01 ENCOUNTER — Telehealth: Payer: Self-pay | Admitting: *Deleted

## 2019-01-01 DIAGNOSIS — I10 Essential (primary) hypertension: Secondary | ICD-10-CM | POA: Diagnosis not present

## 2019-01-01 DIAGNOSIS — E119 Type 2 diabetes mellitus without complications: Secondary | ICD-10-CM | POA: Diagnosis not present

## 2019-01-01 DIAGNOSIS — M1990 Unspecified osteoarthritis, unspecified site: Secondary | ICD-10-CM | POA: Diagnosis not present

## 2019-01-01 DIAGNOSIS — E785 Hyperlipidemia, unspecified: Secondary | ICD-10-CM | POA: Diagnosis not present

## 2019-01-01 NOTE — ED Notes (Signed)
LWBS before triage, PT's primary doctor is able to see him today.

## 2019-01-01 NOTE — Telephone Encounter (Signed)
Attempted to call patient back from message left on Triage line. No voice mail no answer.

## 2019-07-20 DIAGNOSIS — E785 Hyperlipidemia, unspecified: Secondary | ICD-10-CM | POA: Diagnosis not present

## 2019-07-20 DIAGNOSIS — E119 Type 2 diabetes mellitus without complications: Secondary | ICD-10-CM | POA: Diagnosis not present

## 2019-07-20 DIAGNOSIS — I10 Essential (primary) hypertension: Secondary | ICD-10-CM | POA: Diagnosis not present

## 2019-07-24 DIAGNOSIS — I10 Essential (primary) hypertension: Secondary | ICD-10-CM | POA: Diagnosis not present

## 2019-07-24 DIAGNOSIS — E119 Type 2 diabetes mellitus without complications: Secondary | ICD-10-CM | POA: Diagnosis not present

## 2020-01-22 DIAGNOSIS — J029 Acute pharyngitis, unspecified: Secondary | ICD-10-CM | POA: Diagnosis not present

## 2020-03-25 DIAGNOSIS — Z125 Encounter for screening for malignant neoplasm of prostate: Secondary | ICD-10-CM | POA: Diagnosis not present

## 2020-03-25 DIAGNOSIS — E785 Hyperlipidemia, unspecified: Secondary | ICD-10-CM | POA: Diagnosis not present

## 2020-03-25 DIAGNOSIS — I10 Essential (primary) hypertension: Secondary | ICD-10-CM | POA: Diagnosis not present

## 2020-03-25 DIAGNOSIS — E119 Type 2 diabetes mellitus without complications: Secondary | ICD-10-CM | POA: Diagnosis not present

## 2020-03-28 DIAGNOSIS — I10 Essential (primary) hypertension: Secondary | ICD-10-CM | POA: Diagnosis not present

## 2020-03-28 DIAGNOSIS — E785 Hyperlipidemia, unspecified: Secondary | ICD-10-CM | POA: Diagnosis not present

## 2020-03-28 DIAGNOSIS — E119 Type 2 diabetes mellitus without complications: Secondary | ICD-10-CM | POA: Diagnosis not present

## 2020-05-10 ENCOUNTER — Other Ambulatory Visit: Payer: Self-pay | Admitting: Internal Medicine

## 2020-05-12 ENCOUNTER — Other Ambulatory Visit: Payer: Self-pay | Admitting: Internal Medicine

## 2020-07-22 ENCOUNTER — Other Ambulatory Visit: Payer: Self-pay | Admitting: Internal Medicine

## 2020-08-03 ENCOUNTER — Other Ambulatory Visit: Payer: Self-pay | Admitting: Family Medicine

## 2020-08-03 ENCOUNTER — Ambulatory Visit
Admission: RE | Admit: 2020-08-03 | Discharge: 2020-08-03 | Disposition: A | Discharge status: Home / Self Care | Payer: 59 | Source: Ambulatory Visit | Attending: Family Medicine | Admitting: Family Medicine

## 2020-08-03 ENCOUNTER — Other Ambulatory Visit: Payer: Self-pay

## 2020-08-03 DIAGNOSIS — I708 Atherosclerosis of other arteries: Secondary | ICD-10-CM | POA: Diagnosis not present

## 2020-08-03 DIAGNOSIS — I724 Aneurysm of artery of lower extremity: Secondary | ICD-10-CM | POA: Diagnosis not present

## 2020-08-03 DIAGNOSIS — M1712 Unilateral primary osteoarthritis, left knee: Secondary | ICD-10-CM | POA: Diagnosis not present

## 2020-08-03 DIAGNOSIS — M25562 Pain in left knee: Secondary | ICD-10-CM | POA: Diagnosis not present

## 2020-08-03 DIAGNOSIS — M79662 Pain in left lower leg: Secondary | ICD-10-CM

## 2020-08-03 DIAGNOSIS — M25462 Effusion, left knee: Secondary | ICD-10-CM | POA: Diagnosis not present

## 2020-08-10 ENCOUNTER — Other Ambulatory Visit: Payer: Self-pay

## 2020-08-10 DIAGNOSIS — I724 Aneurysm of artery of lower extremity: Secondary | ICD-10-CM

## 2020-09-08 ENCOUNTER — Other Ambulatory Visit: Payer: Self-pay

## 2020-09-08 ENCOUNTER — Ambulatory Visit (HOSPITAL_COMMUNITY)
Admission: RE | Admit: 2020-09-08 | Discharge: 2020-09-08 | Disposition: A | Discharge status: Home / Self Care | Payer: 59 | Source: Ambulatory Visit | Attending: Vascular Surgery | Admitting: Vascular Surgery

## 2020-09-08 DIAGNOSIS — I724 Aneurysm of artery of lower extremity: Secondary | ICD-10-CM

## 2020-09-08 DIAGNOSIS — N200 Calculus of kidney: Secondary | ICD-10-CM | POA: Diagnosis not present

## 2020-09-08 DIAGNOSIS — I70203 Unspecified atherosclerosis of native arteries of extremities, bilateral legs: Secondary | ICD-10-CM | POA: Diagnosis not present

## 2020-09-08 DIAGNOSIS — I700 Atherosclerosis of aorta: Secondary | ICD-10-CM | POA: Diagnosis not present

## 2020-09-08 LAB — POCT I-STAT CREATININE: Creatinine, Ser: 0.9 mg/dL (ref 0.61–1.24)

## 2020-09-08 MED ORDER — IOHEXOL 350 MG/ML SOLN
100.0000 mL | Freq: Once | INTRAVENOUS | Status: AC | PRN
  Administered 2020-09-08: 100 mL via INTRAVENOUS

## 2020-09-14 ENCOUNTER — Encounter: Payer: Self-pay | Admitting: Vascular Surgery

## 2020-09-14 ENCOUNTER — Ambulatory Visit: Payer: 59 | Admitting: Vascular Surgery

## 2020-09-14 ENCOUNTER — Other Ambulatory Visit: Payer: Self-pay

## 2020-09-14 VITALS — BP 129/89 | HR 91 | Temp 98.7°F | Resp 20 | Ht 68.0 in | Wt 219.0 lb

## 2020-09-14 DIAGNOSIS — I724 Aneurysm of artery of lower extremity: Secondary | ICD-10-CM | POA: Diagnosis not present

## 2020-09-14 NOTE — Progress Notes (Signed)
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 Imogene Burn.  Looking at his duplex scan in his CT angiogram I do not see any problems with the bypass graft on the left.  I do not see any flow into the excluded popliteal artery.  His symptoms are somewhat unusual and he does have evidence of significant venous disease on exam.  He has CEAP C4a venous disease.  I think some of his symptoms could be related to his venous disease.  His symptoms do not sound like claudication or symptoms related to peripheral vascular disease.  If his symptoms worsen we could consider arteriography however based on the information I have at this point I think his bypass graft looks fine without any significant problems identified.  I have ordered a graft duplex in 1 year and will also ultrasound his right popliteal artery to be sure that he does not develop an aneurysm on the right side.  The artery currently is just slightly ectatic.  CHRONIC VENOUS INSUFFICIENCY: This patient does have evidence of chronic venous insufficiency.  We have discussed the importance of intermittent leg elevation the proper positioning for this.  In addition I have encouraged him to consider a Knee-high compression stocking with a mild gradient.  I have encouraged him to avoid prolonged sitting and standing.  We have discussed the importance of exercise and also the importance of maintaining a healthy weight.  If his venous symptoms progress then certainly we could consider formal venous reflux testing.    HPI:   Douglas Anthony is a pleasant 59 y.o. male who presented with a left popliteal artery aneurysm.  He underwent repair by Dr.Chen on 11/16/2015.  The aneurysm was excluded proximally and distally and the anastomoses at both ends were end to end.  He was then lost to  follow-up.  He states over the last year he has had some stabbing pain in his incisions, some occasional pain behind his knees, and pain in his left leg with prolonged standing and ambulation.  He spends long hours on his feet at work and this is when his symptoms are the worst.  He has not been wearing compression stockings.  He has tried leg elevation which helps some.  I do not get any clear-cut history of claudication or rest pain.  He does have a history of a previous DVT and was treated with Lovenox in the past.  This was about 15 years ago according to the patient.  Past Medical History:  Diagnosis Date  . Arthritis   . Blood clot in vein    left leg; Tx Oral medication  . Diabetes mellitus without complication (HCC)   . Hyperlipemia   . Hypertension   . PONV (postoperative nausea and vomiting)     History reviewed. No pertinent family history.  SOCIAL HISTORY: Social History   Tobacco Use  . Smoking status: Former Smoker    Packs/day: 0.25    Years: 30.00    Pack years: 7.50    Types: Cigarettes    Quit date: 08/29/2020    Years since quitting: 0.0  . Smokeless tobacco: Never Used  Substance Use Topics  . Alcohol use: Yes    Alcohol/week: 1.0 - 2.0 standard drink    Types: 1 - 2 Shots of liquor per week  Comment: weekly; weekend    No Known Allergies  Current Outpatient Medications  Medication Sig Dispense Refill  . amLODipine (NORVASC) 10 MG tablet Take 10 mg by mouth daily.     Marland Kitchen aspirin 81 MG tablet Take 81 mg by mouth daily.    Marland Kitchen atorvastatin (LIPITOR) 20 MG tablet Take 20 mg by mouth daily.     Marland Kitchen etodolac (LODINE) 500 MG tablet Take 500 mg by mouth daily.     Marland Kitchen LORazepam (ATIVAN) 1 MG tablet Take 1 mg by mouth every 8 (eight) hours as needed for anxiety. PRN  3  . metFORMIN (GLUCOPHAGE) 500 MG tablet Take 1 tablet (500 mg total) by mouth 2 (two) times daily with a meal. (Patient taking differently: Take 500 mg by mouth. 2 tablets in the morning and 2 at  night)    . quinapril (ACCUPRIL) 40 MG tablet Take 40 mg by mouth daily.    . TRUE METRIX BLOOD GLUCOSE TEST test strip     . TRUEPLUS LANCETS 30G MISC      No current facility-administered medications for this visit.    REVIEW OF SYSTEMS:  [X]  denotes positive finding, [ ]  denotes negative finding Cardiac  Comments:  Chest pain or chest pressure:    Shortness of breath upon exertion:    Short of breath when lying flat:    Irregular heart rhythm:        Vascular    Pain in calf, thigh, or hip brought on by ambulation:    Pain in feet at night that wakes you up from your sleep:     Blood clot in your veins:    Leg swelling:  x       Pulmonary    Oxygen at home:    Productive cough:     Wheezing:         Neurologic    Sudden weakness in arms or legs:     Sudden numbness in arms or legs:     Sudden onset of difficulty speaking or slurred speech:    Temporary loss of vision in one eye:     Problems with dizziness:         Gastrointestinal    Blood in stool:     Vomited blood:         Genitourinary    Burning when urinating:     Blood in urine:        Psychiatric    Major depression:         Hematologic    Bleeding problems:    Problems with blood clotting too easily:        Skin    Rashes or ulcers:        Constitutional    Fever or chills:     PHYSICAL EXAM:   Vitals:   09/14/20 1259  BP: 129/89  Pulse: 91  Resp: 20  Temp: 98.7 F (37.1 C)  SpO2: 95%  Weight: 219 lb (99.3 kg)  Height: 5\' 8"  (1.727 m)    GENERAL: The patient is a well-nourished male, in no acute distress. The vital signs are documented above. CARDIAC: There is a regular rate and rhythm.  VASCULAR: I do not detect carotid bruits. He has palpable femoral popliteal and pedal pulses bilaterally. He has hyperpigmentation bilaterally consistent with chronic venous insufficiency. PULMONARY: There is good air exchange bilaterally without wheezing or rales. ABDOMEN: Soft and non-tender  with normal pitched bowel sounds.  MUSCULOSKELETAL: There are no  major deformities or cyanosis. NEUROLOGIC: No focal weakness or paresthesias are detected. SKIN: There are no ulcers or rashes noted. PSYCHIATRIC: The patient has a normal affect.  DATA:    VENOUS DUPLEX: I reviewed his previous venous duplex scan that was done on 08/03/2020.  This does not show any evidence of DVT.  His bypass graft is patent.  There was a suggestion of possibly some flow into the aneurysm sac but I do not really appreciate this.  CT ANGIOGRAM: I reviewed his CT angiogram.  His bypass graft is patent.  I do not appreciate the suggestion of some collateral flow into the popliteal artery aneurysm which has been excluded.  However certainly is possible that there is a patent small branch but I do not think this would be clinically significant.  Waverly Ferrari [facility] [phone]

## 2020-09-30 DIAGNOSIS — E119 Type 2 diabetes mellitus without complications: Secondary | ICD-10-CM | POA: Diagnosis not present

## 2020-09-30 DIAGNOSIS — E785 Hyperlipidemia, unspecified: Secondary | ICD-10-CM | POA: Diagnosis not present

## 2020-09-30 DIAGNOSIS — I10 Essential (primary) hypertension: Secondary | ICD-10-CM | POA: Diagnosis not present

## 2020-10-03 DIAGNOSIS — I10 Essential (primary) hypertension: Secondary | ICD-10-CM | POA: Diagnosis not present

## 2020-10-03 DIAGNOSIS — E119 Type 2 diabetes mellitus without complications: Secondary | ICD-10-CM | POA: Diagnosis not present

## 2020-10-03 DIAGNOSIS — E785 Hyperlipidemia, unspecified: Secondary | ICD-10-CM | POA: Diagnosis not present

## 2020-10-06 DIAGNOSIS — M25462 Effusion, left knee: Secondary | ICD-10-CM | POA: Diagnosis not present

## 2020-10-06 DIAGNOSIS — M25461 Effusion, right knee: Secondary | ICD-10-CM | POA: Diagnosis not present

## 2020-10-06 DIAGNOSIS — M170 Bilateral primary osteoarthritis of knee: Secondary | ICD-10-CM | POA: Diagnosis not present

## 2020-10-06 DIAGNOSIS — M25561 Pain in right knee: Secondary | ICD-10-CM | POA: Diagnosis not present

## 2020-10-18 DIAGNOSIS — M170 Bilateral primary osteoarthritis of knee: Secondary | ICD-10-CM | POA: Diagnosis not present

## 2020-10-18 DIAGNOSIS — M25461 Effusion, right knee: Secondary | ICD-10-CM | POA: Diagnosis not present

## 2020-10-18 DIAGNOSIS — M25462 Effusion, left knee: Secondary | ICD-10-CM | POA: Diagnosis not present

## 2020-10-18 DIAGNOSIS — M25561 Pain in right knee: Secondary | ICD-10-CM | POA: Diagnosis not present

## 2020-11-18 ENCOUNTER — Other Ambulatory Visit: Payer: Self-pay | Admitting: Sports Medicine

## 2020-11-18 ENCOUNTER — Other Ambulatory Visit (HOSPITAL_COMMUNITY): Payer: Self-pay | Admitting: Sports Medicine

## 2020-11-18 DIAGNOSIS — M25561 Pain in right knee: Secondary | ICD-10-CM | POA: Diagnosis not present

## 2020-11-18 DIAGNOSIS — M1712 Unilateral primary osteoarthritis, left knee: Secondary | ICD-10-CM | POA: Diagnosis not present

## 2020-11-18 DIAGNOSIS — M1711 Unilateral primary osteoarthritis, right knee: Secondary | ICD-10-CM

## 2020-11-18 DIAGNOSIS — M25461 Effusion, right knee: Secondary | ICD-10-CM | POA: Diagnosis not present

## 2020-11-18 DIAGNOSIS — G8929 Other chronic pain: Secondary | ICD-10-CM

## 2020-11-18 DIAGNOSIS — M25562 Pain in left knee: Secondary | ICD-10-CM

## 2020-11-23 ENCOUNTER — Ambulatory Visit
Admission: RE | Admit: 2020-11-23 | Discharge: 2020-11-23 | Disposition: A | Discharge status: Home / Self Care | Payer: 59 | Source: Ambulatory Visit | Attending: Sports Medicine | Admitting: Sports Medicine

## 2020-11-23 ENCOUNTER — Other Ambulatory Visit: Payer: Self-pay

## 2020-11-23 ENCOUNTER — Other Ambulatory Visit: Payer: Self-pay | Admitting: Internal Medicine

## 2020-11-23 DIAGNOSIS — M25561 Pain in right knee: Secondary | ICD-10-CM | POA: Diagnosis not present

## 2020-11-23 DIAGNOSIS — M25761 Osteophyte, right knee: Secondary | ICD-10-CM | POA: Diagnosis not present

## 2020-11-23 DIAGNOSIS — G8929 Other chronic pain: Secondary | ICD-10-CM | POA: Insufficient documentation

## 2020-11-23 DIAGNOSIS — M1711 Unilateral primary osteoarthritis, right knee: Secondary | ICD-10-CM | POA: Diagnosis not present

## 2020-11-23 DIAGNOSIS — M25562 Pain in left knee: Secondary | ICD-10-CM | POA: Insufficient documentation

## 2020-11-23 DIAGNOSIS — M25461 Effusion, right knee: Secondary | ICD-10-CM | POA: Insufficient documentation

## 2020-12-05 DIAGNOSIS — M1711 Unilateral primary osteoarthritis, right knee: Secondary | ICD-10-CM | POA: Diagnosis not present

## 2020-12-08 ENCOUNTER — Other Ambulatory Visit: Payer: Self-pay | Admitting: Orthopedic Surgery

## 2021-01-03 ENCOUNTER — Other Ambulatory Visit: Payer: Self-pay

## 2021-01-03 ENCOUNTER — Encounter
Admission: RE | Admit: 2021-01-03 | Discharge: 2021-01-03 | Disposition: A | Discharge status: Home / Self Care | Payer: 59 | Source: Ambulatory Visit | Attending: Orthopedic Surgery | Admitting: Orthopedic Surgery

## 2021-01-03 DIAGNOSIS — Z01818 Encounter for other preprocedural examination: Secondary | ICD-10-CM | POA: Diagnosis not present

## 2021-01-03 DIAGNOSIS — Z01810 Encounter for preprocedural cardiovascular examination: Secondary | ICD-10-CM | POA: Diagnosis not present

## 2021-01-03 HISTORY — DX: Anxiety disorder, unspecified: F41.9

## 2021-01-03 HISTORY — DX: Aneurysm of artery of lower extremity: I72.4

## 2021-01-03 LAB — CBC WITH DIFFERENTIAL/PLATELET
Abs Immature Granulocytes: 0.03 10*3/uL (ref 0.00–0.07)
Basophils Absolute: 0.1 10*3/uL (ref 0.0–0.1)
Basophils Relative: 1 %
Eosinophils Absolute: 0.2 10*3/uL (ref 0.0–0.5)
Eosinophils Relative: 3 %
HCT: 43.5 % (ref 39.0–52.0)
Hemoglobin: 15.1 g/dL (ref 13.0–17.0)
Immature Granulocytes: 1 %
Lymphocytes Relative: 25 %
Lymphs Abs: 1.5 10*3/uL (ref 0.7–4.0)
MCH: 31.3 pg (ref 26.0–34.0)
MCHC: 34.7 g/dL (ref 30.0–36.0)
MCV: 90.1 fL (ref 80.0–100.0)
Monocytes Absolute: 0.4 10*3/uL (ref 0.1–1.0)
Monocytes Relative: 7 %
Neutro Abs: 3.6 10*3/uL (ref 1.7–7.7)
Neutrophils Relative %: 63 %
Platelets: 230 10*3/uL (ref 150–400)
RBC: 4.83 MIL/uL (ref 4.22–5.81)
RDW: 11.6 % (ref 11.5–15.5)
WBC: 5.8 10*3/uL (ref 4.0–10.5)
nRBC: 0 % (ref 0.0–0.2)

## 2021-01-03 LAB — SURGICAL PCR SCREEN
MRSA, PCR: NEGATIVE
Staphylococcus aureus: NEGATIVE

## 2021-01-03 LAB — URINALYSIS, ROUTINE W REFLEX MICROSCOPIC
Bacteria, UA: NONE SEEN
Bilirubin Urine: NEGATIVE
Glucose, UA: NEGATIVE mg/dL
Ketones, ur: NEGATIVE mg/dL
Leukocytes,Ua: NEGATIVE
Nitrite: NEGATIVE
Protein, ur: >=300 mg/dL — AB
Specific Gravity, Urine: 1.025 (ref 1.005–1.030)
pH: 5 (ref 5.0–8.0)

## 2021-01-03 LAB — TYPE AND SCREEN
ABO/RH(D): B POS
Antibody Screen: NEGATIVE

## 2021-01-03 LAB — COMPREHENSIVE METABOLIC PANEL
ALT: 33 U/L (ref 0–44)
AST: 26 U/L (ref 15–41)
Albumin: 4.4 g/dL (ref 3.5–5.0)
Alkaline Phosphatase: 46 U/L (ref 38–126)
Anion gap: 13 (ref 5–15)
BUN: 11 mg/dL (ref 6–20)
CO2: 23 mmol/L (ref 22–32)
Calcium: 8.9 mg/dL (ref 8.9–10.3)
Chloride: 101 mmol/L (ref 98–111)
Creatinine, Ser: 0.65 mg/dL (ref 0.61–1.24)
GFR, Estimated: >60 mL/min (ref >60)
Glucose, Bld: 146 mg/dL — ABNORMAL HIGH (ref 70–99)
Potassium: 3.9 mmol/L (ref 3.5–5.1)
Sodium: 137 mmol/L (ref 135–145)
Total Bilirubin: 0.6 mg/dL (ref 0.3–1.2)
Total Protein: 7.6 g/dL (ref 6.5–8.1)

## 2021-01-03 NOTE — Patient Instructions (Signed)
Your procedure is scheduled on: January 13, 2021 FRIDAY Report to the Registration Desk on the 1st floor of the [facility] To find out your arrival time, please call [phone] between 1PM - 3PM on: Thursday January 12, 2021  REMEMBER: Instructions that are not followed completely may result in serious medical risk, up to and including death; or upon the discretion of your surgeon and anesthesiologist your surgery may need to be rescheduled.  Do not eat food after midnight the night before surgery.  No gum chewing, lozengers or hard candies.  You may however, drink CLEAR liquids up to 2 hours before you are scheduled to arrive for your surgery. Do not drink anything within 2 hours of your scheduled arrival time.  Clear liquids include: - water   Type 1 and Type 2 diabetics should only drink water.   TAKE THESE MEDICATIONS THE MORNING OF SURGERY WITH A SIP OF WATER: ATORVASTATIN XYZAL IF NEEDED  DO NOT TAKE QUINAPRIL THE DAY OF SURGERY  Stop Metformin  2 days prior to surgery. LAST DOSE 01/11/2020 TUESDAY  Follow recommendations from Cardiologist, Pulmonologist or PCP regarding stopping Aspirin, Coumadin, Plavix, Eliquis, Pradaxa, or Pletal. CONTACT [facility] OFFICE ABOUT STOPPING ASPIRIN   One week prior to surgery: Stop Anti-inflammatories (NSAIDS) such as Advil, Aleve, Ibuprofen, Motrin, Naproxen, Naprosyn and Aspirin based products such as Excedrin, Goodys Powder, BC Powder. Stop ANY OVER THE COUNTER supplements until after surgery. STOP TURMERIC (However, you may continue taking Vitamin D, Vitamin B, and multivitamin up until the day before surgery.)  No Alcohol for 24 hours before or after surgery.  No Smoking including e-cigarettes for 24 hours prior to surgery.  No chewable tobacco products for at least 6 hours prior to surgery.  No nicotine patches on the day of surgery.  Do not use any "recreational" drugs for at least a week prior to your surgery.  Please be  advised that the combination of cocaine and anesthesia may have negative outcomes, up to and including death. If you test positive for cocaine, your surgery will be cancelled.  On the morning of surgery brush your teeth with toothpaste and water, you may rinse your mouth with mouthwash if you wish. Do not swallow any toothpaste or mouthwash.  Do not wear jewelry, make-up, hairpins, clips or nail polish.  Do not wear lotions, powders, or perfumes.   Do not shave body from the neck down 48 hours prior to surgery just in case you cut yourself which could leave a site for infection.  Also, freshly shaved skin may become irritated if using the CHG soap.  Contact lenses, hearing aids and dentures may not be worn into surgery.  Do not bring valuables to the hospital. [facility] is not responsible for any missing/lost belongings or valuables.   Use CHG Soap as directed on instruction sheet.  Notify your doctor if there is any change in your medical condition (cold, fever, infection).  Wear comfortable clothing (specific to your surgery type) to the hospital.  Plan for stool softeners for home use; pain medications have a tendency to cause constipation. You can also help prevent constipation by eating foods high in fiber such as fruits and vegetables and drinking plenty of fluids as your diet allows.  After surgery, you can help prevent lung complications by doing breathing exercises.  Take deep breaths and cough every 1-2 hours. Your doctor may order a device called an Incentive Spirometer to help you take deep breaths. When coughing or  sneezing, hold a pillow firmly against your incision with both hands. This is called "splinting." Doing this helps protect your incision. It also decreases belly discomfort.  If you are being admitted to the hospital overnight YOU MAY BRING A SMALL BAG WITH YOU  If you are being discharged the day of surgery, you will not be allowed to drive home. You will  need a responsible adult (18 years or older) to drive you home and stay with you that night.    Please call the Pre-admissions Testing Dept. at [phone] if you have any questions about these instructions.  Visitation Policy:  Patients undergoing a surgery or procedure may have one family member or support person with them as long as that person is not COVID-19 positive or experiencing its symptoms.  That person may remain in the waiting area during the procedure.  Inpatient Visitation:    Visiting hours are 7 a.m. to 8 p.m. Patients will be allowed one visitor. The visitor may change daily. The visitor must pass COVID-19 screenings, use hand sanitizer when entering and exiting the patient's room and wear a mask at all times, including in the patient's room. Patients must also wear a mask when staff or their visitor are in the room. Masking is required regardless of vaccination status. Systemwide, no visitors 17 or younger.

## 2021-01-11 ENCOUNTER — Other Ambulatory Visit: Admission: RE | Admit: 2021-01-11 | Payer: 59 | Source: Ambulatory Visit

## 2021-01-13 ENCOUNTER — Inpatient Hospital Stay: Admission: RE | Admit: 2021-01-13 | Payer: 59 | Source: Ambulatory Visit | Admitting: Orthopedic Surgery

## 2021-01-13 ENCOUNTER — Encounter: Admission: RE | Payer: Self-pay | Source: Ambulatory Visit

## 2021-01-13 SURGERY — ARTHROPLASTY, KNEE, TOTAL
Anesthesia: Choice | Site: Knee | Laterality: Right

## 2021-01-27 ENCOUNTER — Other Ambulatory Visit: Payer: Self-pay | Admitting: Orthopedic Surgery

## 2021-02-06 ENCOUNTER — Other Ambulatory Visit: Payer: 59

## 2021-02-06 ENCOUNTER — Other Ambulatory Visit: Payer: Self-pay | Admitting: Internal Medicine

## 2021-02-10 ENCOUNTER — Inpatient Hospital Stay: Admission: RE | Admit: 2021-02-10 | Payer: 59 | Source: Ambulatory Visit

## 2021-02-10 ENCOUNTER — Other Ambulatory Visit: Admission: RE | Admit: 2021-02-10 | Payer: 59 | Source: Ambulatory Visit

## 2021-02-10 ENCOUNTER — Other Ambulatory Visit
Admission: RE | Admit: 2021-02-10 | Discharge: 2021-02-10 | Disposition: A | Discharge status: Home / Self Care | Payer: 59 | Source: Ambulatory Visit | Attending: Orthopedic Surgery | Admitting: Orthopedic Surgery

## 2021-02-10 ENCOUNTER — Other Ambulatory Visit: Payer: Self-pay

## 2021-02-10 DIAGNOSIS — Z20822 Contact with and (suspected) exposure to covid-19: Secondary | ICD-10-CM | POA: Insufficient documentation

## 2021-02-10 DIAGNOSIS — Z01812 Encounter for preprocedural laboratory examination: Secondary | ICD-10-CM | POA: Insufficient documentation

## 2021-02-11 LAB — SARS CORONAVIRUS 2 (TAT 6-24 HRS): SARS Coronavirus 2: NEGATIVE

## 2021-02-14 ENCOUNTER — Other Ambulatory Visit: Payer: Self-pay

## 2021-02-14 ENCOUNTER — Inpatient Hospital Stay: Payer: 59

## 2021-02-14 ENCOUNTER — Inpatient Hospital Stay: Payer: 59 | Admitting: Certified Registered Nurse Anesthetist

## 2021-02-14 ENCOUNTER — Encounter: Payer: Self-pay | Admitting: Orthopedic Surgery

## 2021-02-14 ENCOUNTER — Inpatient Hospital Stay
Admission: RE | Admit: 2021-02-14 | Discharge: 2021-02-16 | DRG: 470 | Disposition: A | Discharge status: Home Health | Payer: 59 | Attending: Orthopedic Surgery | Admitting: Orthopedic Surgery

## 2021-02-14 ENCOUNTER — Encounter
Admission: RE | Disposition: A | Discharge status: Home Health | Payer: Self-pay | Source: Home / Self Care | Attending: Orthopedic Surgery

## 2021-02-14 DIAGNOSIS — M1711 Unilateral primary osteoarthritis, right knee: Principal | ICD-10-CM | POA: Diagnosis present

## 2021-02-14 DIAGNOSIS — E119 Type 2 diabetes mellitus without complications: Secondary | ICD-10-CM | POA: Diagnosis not present

## 2021-02-14 DIAGNOSIS — Z7982 Long term (current) use of aspirin: Secondary | ICD-10-CM | POA: Diagnosis not present

## 2021-02-14 DIAGNOSIS — F419 Anxiety disorder, unspecified: Secondary | ICD-10-CM | POA: Diagnosis present

## 2021-02-14 DIAGNOSIS — Z7984 Long term (current) use of oral hypoglycemic drugs: Secondary | ICD-10-CM | POA: Diagnosis not present

## 2021-02-14 DIAGNOSIS — I10 Essential (primary) hypertension: Secondary | ICD-10-CM | POA: Diagnosis not present

## 2021-02-14 DIAGNOSIS — Z96651 Presence of right artificial knee joint: Secondary | ICD-10-CM | POA: Diagnosis not present

## 2021-02-14 DIAGNOSIS — E785 Hyperlipidemia, unspecified: Secondary | ICD-10-CM | POA: Diagnosis not present

## 2021-02-14 DIAGNOSIS — Z471 Aftercare following joint replacement surgery: Secondary | ICD-10-CM | POA: Diagnosis not present

## 2021-02-14 DIAGNOSIS — Z79899 Other long term (current) drug therapy: Secondary | ICD-10-CM | POA: Diagnosis not present

## 2021-02-14 DIAGNOSIS — G8918 Other acute postprocedural pain: Secondary | ICD-10-CM

## 2021-02-14 HISTORY — PX: TOTAL KNEE ARTHROPLASTY: SHX125

## 2021-02-14 LAB — GLUCOSE, CAPILLARY
Glucose-Capillary: 222 mg/dL — ABNORMAL HIGH (ref 70–99)
Glucose-Capillary: 176 mg/dL — ABNORMAL HIGH (ref 70–99)
Glucose-Capillary: 169 mg/dL — ABNORMAL HIGH (ref 70–99)
Glucose-Capillary: 181 mg/dL — ABNORMAL HIGH (ref 70–99)
Glucose-Capillary: 179 mg/dL — ABNORMAL HIGH (ref 70–99)

## 2021-02-14 LAB — CBC
HCT: 37.6 % — ABNORMAL LOW (ref 39.0–52.0)
Hemoglobin: 13.5 g/dL (ref 13.0–17.0)
MCH: 32 pg (ref 26.0–34.0)
MCHC: 35.9 g/dL (ref 30.0–36.0)
MCV: 89.1 fL (ref 80.0–100.0)
Platelets: 187 10*3/uL (ref 150–400)
RBC: 4.22 MIL/uL (ref 4.22–5.81)
RDW: 11.7 % (ref 11.5–15.5)
WBC: 6.3 10*3/uL (ref 4.0–10.5)
nRBC: 0 % (ref 0.0–0.2)

## 2021-02-14 LAB — TYPE AND SCREEN
ABO/RH(D): B POS
Antibody Screen: NEGATIVE

## 2021-02-14 LAB — CREATININE, SERUM
Creatinine, Ser: 0.75 mg/dL (ref 0.61–1.24)
GFR, Estimated: >60 mL/min (ref >60)

## 2021-02-14 SURGERY — ARTHROPLASTY, KNEE, TOTAL
Anesthesia: Spinal | Site: Knee | Laterality: Right

## 2021-02-14 MED ORDER — PROPOFOL 500 MG/50ML IV EMUL
INTRAVENOUS | Status: AC
  Filled 2021-02-14: qty 50

## 2021-02-14 MED ORDER — METHOCARBAMOL 1000 MG/10ML IJ SOLN
500.0000 mg | Freq: Four times a day (QID) | INTRAVENOUS | Status: DC | PRN
  Filled 2021-02-14: qty 5

## 2021-02-14 MED ORDER — FENTANYL CITRATE (PF) 100 MCG/2ML IJ SOLN
INTRAMUSCULAR | Status: DC | PRN
  Administered 2021-02-14 (×2): 50 ug via INTRAVENOUS

## 2021-02-14 MED ORDER — METHOCARBAMOL 500 MG PO TABS
500.0000 mg | ORAL_TABLET | Freq: Four times a day (QID) | ORAL | Status: DC | PRN
  Administered 2021-02-14 – 2021-02-15 (×2): 500 mg via ORAL
  Filled 2021-02-14 (×2): qty 1

## 2021-02-14 MED ORDER — MIDAZOLAM HCL 2 MG/2ML IJ SOLN
INTRAMUSCULAR | Status: AC
  Filled 2021-02-14: qty 2

## 2021-02-14 MED ORDER — KETOROLAC TROMETHAMINE 30 MG/ML IJ SOLN
INTRAMUSCULAR | Status: DC | PRN
  Administered 2021-02-14: 30 mg

## 2021-02-14 MED ORDER — PHENOL 1.4 % MT LIQD
1.0000 | OROMUCOSAL | Status: DC | PRN
  Filled 2021-02-14: qty 177

## 2021-02-14 MED ORDER — CEFAZOLIN SODIUM-DEXTROSE 2-4 GM/100ML-% IV SOLN
2.0000 g | INTRAVENOUS | Status: AC
  Administered 2021-02-14: 2 g via INTRAVENOUS

## 2021-02-14 MED ORDER — SODIUM CHLORIDE 0.9 % IV SOLN: INTRAVENOUS | Status: DC

## 2021-02-14 MED ORDER — FENTANYL CITRATE (PF) 100 MCG/2ML IJ SOLN: 25.0000 ug | INTRAMUSCULAR | Status: DC | PRN

## 2021-02-14 MED ORDER — CEFAZOLIN SODIUM-DEXTROSE 2-4 GM/100ML-% IV SOLN
INTRAVENOUS | Status: AC
  Filled 2021-02-14: qty 100

## 2021-02-14 MED ORDER — MORPHINE SULFATE (PF) 10 MG/ML IV SOLN
INTRAVENOUS | Status: AC
  Filled 2021-02-14: qty 1

## 2021-02-14 MED ORDER — CHLORHEXIDINE GLUCONATE 0.12 % MT SOLN: 15.0000 mL | Freq: Once | OROMUCOSAL | Status: AC

## 2021-02-14 MED ORDER — ONDANSETRON HCL 4 MG/2ML IJ SOLN
INTRAMUSCULAR | Status: DC | PRN
  Administered 2021-02-14: 4 mg via INTRAVENOUS

## 2021-02-14 MED ORDER — MIDAZOLAM HCL 5 MG/5ML IJ SOLN
INTRAMUSCULAR | Status: DC | PRN
  Administered 2021-02-14 (×2): 2 mg via INTRAVENOUS

## 2021-02-14 MED ORDER — TRAMADOL HCL 50 MG PO TABS
50.0000 mg | ORAL_TABLET | Freq: Four times a day (QID) | ORAL | Status: DC
  Administered 2021-02-14 – 2021-02-16 (×9): 50 mg via ORAL
  Filled 2021-02-14 (×9): qty 1

## 2021-02-14 MED ORDER — CETIRIZINE HCL 10 MG PO TABS
5.0000 mg | ORAL_TABLET | Freq: Every day | ORAL | Status: DC
Start: 2021-02-14 — End: 2021-02-16
  Administered 2021-02-14 – 2021-02-15 (×2): 5 mg via ORAL
  Filled 2021-02-14 (×3): qty 1

## 2021-02-14 MED ORDER — DOCUSATE SODIUM 100 MG PO CAPS
100.0000 mg | ORAL_CAPSULE | Freq: Two times a day (BID) | ORAL | Status: DC
  Administered 2021-02-14 – 2021-02-16 (×5): 100 mg via ORAL
  Filled 2021-02-14 (×5): qty 1

## 2021-02-14 MED ORDER — AMLODIPINE BESYLATE 10 MG PO TABS
10.0000 mg | ORAL_TABLET | Freq: Every day | ORAL | Status: DC
  Administered 2021-02-14 – 2021-02-16 (×3): 10 mg via ORAL
  Filled 2021-02-14 (×3): qty 1

## 2021-02-14 MED ORDER — ACETAMINOPHEN 325 MG PO TABS: 325.0000 mg | ORAL_TABLET | Freq: Four times a day (QID) | ORAL | Status: DC | PRN

## 2021-02-14 MED ORDER — INSULIN ASPART 100 UNIT/ML ~~LOC~~ SOLN
0.0000 [IU] | Freq: Three times a day (TID) | SUBCUTANEOUS | Status: DC
  Administered 2021-02-14 – 2021-02-16 (×4): 3 [IU] via SUBCUTANEOUS
  Filled 2021-02-14 (×4): qty 1

## 2021-02-14 MED ORDER — METOCLOPRAMIDE HCL 5 MG/ML IJ SOLN: 5.0000 mg | Freq: Three times a day (TID) | INTRAMUSCULAR | Status: DC | PRN

## 2021-02-14 MED ORDER — MORPHINE SULFATE (PF) 10 MG/ML IV SOLN
INTRAVENOUS | Status: DC | PRN
  Administered 2021-02-14: 10 mg

## 2021-02-14 MED ORDER — ORAL CARE MOUTH RINSE: 15.0000 mL | Freq: Once | OROMUCOSAL | Status: AC

## 2021-02-14 MED ORDER — QUINAPRIL HCL 10 MG PO TABS
40.0000 mg | ORAL_TABLET | Freq: Every day | ORAL | Status: DC
Start: 2021-02-14 — End: 2021-02-16
  Administered 2021-02-14 – 2021-02-15 (×2): 40 mg via ORAL
  Filled 2021-02-14 (×3): qty 4

## 2021-02-14 MED ORDER — KETOROLAC TROMETHAMINE 30 MG/ML IJ SOLN
INTRAMUSCULAR | Status: AC
  Filled 2021-02-14: qty 1

## 2021-02-14 MED ORDER — MAGNESIUM CITRATE PO SOLN
1.0000 | Freq: Once | ORAL | Status: DC | PRN
  Filled 2021-02-14: qty 296

## 2021-02-14 MED ORDER — PROPOFOL 500 MG/50ML IV EMUL
INTRAVENOUS | Status: DC | PRN
  Administered 2021-02-14: 75 ug/kg/min via INTRAVENOUS

## 2021-02-14 MED ORDER — BISACODYL 10 MG RE SUPP: 10.0000 mg | Freq: Every day | RECTAL | Status: DC | PRN

## 2021-02-14 MED ORDER — METOCLOPRAMIDE HCL 10 MG PO TABS: 5.0000 mg | ORAL_TABLET | Freq: Three times a day (TID) | ORAL | Status: DC | PRN

## 2021-02-14 MED ORDER — OXYCODONE HCL 5 MG PO TABS
10.0000 mg | ORAL_TABLET | ORAL | Status: DC | PRN
  Administered 2021-02-15 – 2021-02-16 (×3): 15 mg via ORAL
  Filled 2021-02-14 (×3): qty 3

## 2021-02-14 MED ORDER — NEOMYCIN-POLYMYXIN B GU 40-200000 IR SOLN
Status: DC | PRN
  Administered 2021-02-14: 16 mL

## 2021-02-14 MED ORDER — ENOXAPARIN SODIUM 30 MG/0.3ML ~~LOC~~ SOLN
30.0000 mg | Freq: Two times a day (BID) | SUBCUTANEOUS | Status: DC
  Administered 2021-02-15 – 2021-02-16 (×3): 30 mg via SUBCUTANEOUS
  Filled 2021-02-14 (×3): qty 0.3

## 2021-02-14 MED ORDER — ATORVASTATIN CALCIUM 20 MG PO TABS
20.0000 mg | ORAL_TABLET | Freq: Every day | ORAL | Status: DC
Start: 2021-02-14 — End: 2021-02-16
  Administered 2021-02-14 – 2021-02-16 (×3): 20 mg via ORAL
  Filled 2021-02-14 (×3): qty 1

## 2021-02-14 MED ORDER — PANTOPRAZOLE SODIUM 40 MG PO TBEC
40.0000 mg | DELAYED_RELEASE_TABLET | Freq: Every day | ORAL | Status: DC
  Administered 2021-02-14 – 2021-02-16 (×3): 40 mg via ORAL
  Filled 2021-02-14 (×3): qty 1

## 2021-02-14 MED ORDER — PROPOFOL 10 MG/ML IV BOLUS
INTRAVENOUS | Status: DC | PRN
  Administered 2021-02-14 (×2): 30 mg via INTRAVENOUS

## 2021-02-14 MED ORDER — ZOLPIDEM TARTRATE 5 MG PO TABS: 5.0000 mg | ORAL_TABLET | Freq: Every evening | ORAL | Status: DC | PRN

## 2021-02-14 MED ORDER — ACETAMINOPHEN 500 MG PO TABS
1000.0000 mg | ORAL_TABLET | Freq: Four times a day (QID) | ORAL | Status: AC
  Administered 2021-02-14 – 2021-02-15 (×4): 1000 mg via ORAL
  Filled 2021-02-14 (×4): qty 2

## 2021-02-14 MED ORDER — BUPIVACAINE-EPINEPHRINE (PF) 0.25% -1:200000 IJ SOLN
INTRAMUSCULAR | Status: AC
  Filled 2021-02-14: qty 30

## 2021-02-14 MED ORDER — ALUM & MAG HYDROXIDE-SIMETH 200-200-20 MG/5ML PO SUSP: 30.0000 mL | ORAL | Status: DC | PRN

## 2021-02-14 MED ORDER — MAGNESIUM HYDROXIDE 400 MG/5ML PO SUSP
30.0000 mL | Freq: Every day | ORAL | Status: DC | PRN
Start: 2021-02-14 — End: 2021-02-16

## 2021-02-14 MED ORDER — ASPIRIN EC 81 MG PO TBEC
81.0000 mg | DELAYED_RELEASE_TABLET | Freq: Every day | ORAL | Status: DC
Start: 2021-02-14 — End: 2021-02-16
  Administered 2021-02-14 – 2021-02-16 (×3): 81 mg via ORAL
  Filled 2021-02-14 (×4): qty 1

## 2021-02-14 MED ORDER — SODIUM CHLORIDE FLUSH 0.9 % IV SOLN
INTRAVENOUS | Status: AC
  Filled 2021-02-14: qty 40

## 2021-02-14 MED ORDER — METFORMIN HCL 500 MG PO TABS
1000.0000 mg | ORAL_TABLET | Freq: Two times a day (BID) | ORAL | Status: DC
  Administered 2021-02-14 – 2021-02-16 (×4): 1000 mg via ORAL
  Filled 2021-02-14 (×4): qty 2

## 2021-02-14 MED ORDER — CEFAZOLIN SODIUM-DEXTROSE 2-4 GM/100ML-% IV SOLN
2.0000 g | Freq: Four times a day (QID) | INTRAVENOUS | Status: AC
Start: 2021-02-14 — End: 2021-02-14
  Administered 2021-02-14 (×2): 2 g via INTRAVENOUS
  Filled 2021-02-14 (×2): qty 100

## 2021-02-14 MED ORDER — ONDANSETRON HCL 4 MG PO TABS: 4.0000 mg | ORAL_TABLET | Freq: Four times a day (QID) | ORAL | Status: DC | PRN

## 2021-02-14 MED ORDER — MENTHOL 3 MG MT LOZG
1.0000 | LOZENGE | OROMUCOSAL | Status: DC | PRN
  Filled 2021-02-14: qty 9

## 2021-02-14 MED ORDER — FENTANYL CITRATE (PF) 100 MCG/2ML IJ SOLN
INTRAMUSCULAR | Status: AC
  Filled 2021-02-14: qty 2

## 2021-02-14 MED ORDER — NEOMYCIN-POLYMYXIN B GU 40-200000 IR SOLN
Status: AC
  Filled 2021-02-14: qty 20

## 2021-02-14 MED ORDER — DIPHENHYDRAMINE HCL 12.5 MG/5ML PO ELIX: 12.5000 mg | ORAL_SOLUTION | ORAL | Status: DC | PRN

## 2021-02-14 MED ORDER — LORAZEPAM 1 MG PO TABS: 1.0000 mg | ORAL_TABLET | Freq: Three times a day (TID) | ORAL | Status: DC | PRN

## 2021-02-14 MED ORDER — KETAMINE HCL 50 MG/5ML IJ SOSY
PREFILLED_SYRINGE | INTRAMUSCULAR | Status: AC
  Filled 2021-02-14: qty 5

## 2021-02-14 MED ORDER — MAGNESIUM OXIDE 400 (241.3 MG) MG PO TABS
400.0000 mg | ORAL_TABLET | Freq: Every day | ORAL | Status: DC
  Administered 2021-02-14 – 2021-02-16 (×3): 400 mg via ORAL
  Filled 2021-02-14 (×4): qty 1

## 2021-02-14 MED ORDER — FAMOTIDINE 20 MG PO TABS: 20.0000 mg | ORAL_TABLET | Freq: Once | ORAL | Status: AC

## 2021-02-14 MED ORDER — FAMOTIDINE 20 MG PO TABS
ORAL_TABLET | ORAL | Status: AC
  Administered 2021-02-14: 20 mg via ORAL
  Filled 2021-02-14: qty 1

## 2021-02-14 MED ORDER — ONDANSETRON HCL 4 MG/2ML IJ SOLN: 4.0000 mg | Freq: Four times a day (QID) | INTRAMUSCULAR | Status: DC | PRN

## 2021-02-14 MED ORDER — HYDROMORPHONE HCL 1 MG/ML IJ SOLN: 0.5000 mg | INTRAMUSCULAR | Status: DC | PRN

## 2021-02-14 MED ORDER — ONDANSETRON HCL 4 MG/2ML IJ SOLN
INTRAMUSCULAR | Status: AC
  Filled 2021-02-14: qty 2

## 2021-02-14 MED ORDER — TRANEXAMIC ACID-NACL 1000-0.7 MG/100ML-% IV SOLN: 1000.0000 mg | Freq: Once | INTRAVENOUS | Status: DC

## 2021-02-14 MED ORDER — LIDOCAINE HCL (PF) 2 % IJ SOLN
INTRAMUSCULAR | Status: AC
  Filled 2021-02-14: qty 5

## 2021-02-14 MED ORDER — BUPIVACAINE LIPOSOME 1.3 % IJ SUSP
INTRAMUSCULAR | Status: AC
  Filled 2021-02-14: qty 20

## 2021-02-14 MED ORDER — OXYCODONE HCL 5 MG PO TABS
5.0000 mg | ORAL_TABLET | ORAL | Status: DC | PRN
  Administered 2021-02-14: 5 mg via ORAL
  Filled 2021-02-14: qty 2

## 2021-02-14 MED ORDER — CHLORHEXIDINE GLUCONATE 0.12 % MT SOLN
OROMUCOSAL | Status: AC
  Administered 2021-02-14: 15 mL via OROMUCOSAL
  Filled 2021-02-14: qty 15

## 2021-02-14 MED ORDER — BUPIVACAINE-EPINEPHRINE (PF) 0.25% -1:200000 IJ SOLN
INTRAMUSCULAR | Status: DC | PRN
  Administered 2021-02-14: 30 mL via PERINEURAL

## 2021-02-14 MED ORDER — SODIUM CHLORIDE 0.9 % IV SOLN
INTRAVENOUS | Status: DC | PRN
  Administered 2021-02-14: 60 mL

## 2021-02-14 MED ORDER — KETAMINE HCL 10 MG/ML IJ SOLN
INTRAMUSCULAR | Status: DC | PRN
  Administered 2021-02-14: 30 mg via INTRAVENOUS
  Administered 2021-02-14: 20 mg via INTRAVENOUS

## 2021-02-14 MED ORDER — BUPIVACAINE HCL (PF) 0.5 % IJ SOLN
INTRAMUSCULAR | Status: DC | PRN
  Administered 2021-02-14: 3 mL

## 2021-02-14 MED ORDER — TRANEXAMIC ACID-NACL 1000-0.7 MG/100ML-% IV SOLN
INTRAVENOUS | Status: AC
  Administered 2021-02-14: 1000 mg via INTRAVENOUS
  Filled 2021-02-14: qty 100

## 2021-02-14 SURGICAL SUPPLY — 73 items
BLADE SAGITTAL 25.0X1.19X90 (BLADE) ×2 IMPLANT
BLADE SAW 90X13X1.19 OSCILLAT (BLADE) IMPLANT
BLOCK CUTTING FEMUR 4 RT (MISCELLANEOUS) ×2 IMPLANT
BLOCK CUTTING TIBIAL 4 RT MIS (MISCELLANEOUS) ×2 IMPLANT
BLOCK CUTTING TIBIAL 5 RT (MISCELLANEOUS) ×2 IMPLANT
BNDG ELASTIC 6X5.8 VLCR STR LF (GAUZE/BANDAGES/DRESSINGS) ×2 IMPLANT
CANISTER SUCT 1200ML W/VALVE (MISCELLANEOUS) ×2 IMPLANT
CANISTER SUCT 3000ML PPV (MISCELLANEOUS) ×4 IMPLANT
CANISTER WOUND CARE 500ML ATS (WOUND CARE) ×2 IMPLANT
CEMENT HV SMART SET (Cement) ×4 IMPLANT
CHLORAPREP W/TINT 26 (MISCELLANEOUS) ×2 IMPLANT
COOLER POLAR GLACIER W/PUMP (MISCELLANEOUS) ×2 IMPLANT
COVER WAND RF STERILE (DRAPES) ×2 IMPLANT
CUFF TOURN SGL QUICK 24 (TOURNIQUET CUFF)
CUFF TOURN SGL QUICK 30 (TOURNIQUET CUFF) ×1
CUFF TRNQT CYL 24X4X16.5-23 (TOURNIQUET CUFF) IMPLANT
CUFF TRNQT CYL 30X4X21-28X (TOURNIQUET CUFF) ×1 IMPLANT
DRAPE 3/4 80X56 (DRAPES) ×4 IMPLANT
DRSG MEPILEX SACRM 8.7X9.8 (GAUZE/BANDAGES/DRESSINGS) ×2 IMPLANT
ELECT CAUTERY BLADE 6.4 (BLADE) ×2 IMPLANT
ELECT REM PT RETURN 9FT ADLT (ELECTROSURGICAL) ×2
ELECTRODE REM PT RTRN 9FT ADLT (ELECTROSURGICAL) ×1 IMPLANT
FEMORAL COMP SZ4 RIGHT SPHERE (Femur) ×2 IMPLANT
FEMUR BONE MODEL (MISCELLANEOUS) ×2 IMPLANT
GAUZE SPONGE 4X4 12PLY STRL (GAUZE/BANDAGES/DRESSINGS) ×2 IMPLANT
GAUZE XEROFORM 1X8 LF (GAUZE/BANDAGES/DRESSINGS) ×2 IMPLANT
GLOVE INDICATOR 8.0 STRL GRN (GLOVE) ×2 IMPLANT
GLOVE SURG ORTHO LTX SZ8 (GLOVE) ×2 IMPLANT
GLOVE SURG SYN 9.0  PF PI (GLOVE) ×1
GLOVE SURG SYN 9.0 PF PI (GLOVE) ×1 IMPLANT
GLOVE SURG UNDER POLY LF SZ9 (GLOVE) ×2 IMPLANT
GOWN SRG 2XL LVL 4 RGLN SLV (GOWNS) ×1 IMPLANT
GOWN STRL NON-REIN 2XL LVL4 (GOWNS) ×1
GOWN STRL REUS W/ TWL LRG LVL3 (GOWN DISPOSABLE) ×1 IMPLANT
GOWN STRL REUS W/ TWL XL LVL3 (GOWN DISPOSABLE) ×2 IMPLANT
GOWN STRL REUS W/TWL LRG LVL3 (GOWN DISPOSABLE) ×1
GOWN STRL REUS W/TWL XL LVL3 (GOWN DISPOSABLE) ×2
HOLDER FOLEY CATH W/STRAP (MISCELLANEOUS) ×2 IMPLANT
HOOD PEEL AWAY FLYTE STAYCOOL (MISCELLANEOUS) ×4 IMPLANT
INSERT TIBIAL SZ4 RIGHT 10MM (Insert) ×2 IMPLANT
IRRIGATION SURGIPHOR STRL (IV SOLUTION) ×2 IMPLANT
KIT PREVENA INCISION MGT20CM45 (CANNISTER) ×2 IMPLANT
KIT TURNOVER KIT A (KITS) ×2 IMPLANT
MANIFOLD NEPTUNE II (INSTRUMENTS) ×4 IMPLANT
NDL SAFETY ECLIPSE 18X1.5 (NEEDLE) ×1 IMPLANT
NEEDLE HYPO 18GX1.5 SHARP (NEEDLE) ×1
NEEDLE SPNL 18GX3.5 QUINCKE PK (NEEDLE) ×2 IMPLANT
NEEDLE SPNL 20GX3.5 QUINCKE YW (NEEDLE) ×2 IMPLANT
NS IRRIG 1000ML POUR BTL (IV SOLUTION) ×2 IMPLANT
PACK TOTAL KNEE (MISCELLANEOUS) ×2 IMPLANT
PAD WRAPON POLAR KNEE (MISCELLANEOUS) ×1 IMPLANT
PATELLA RESURFACING MEDACTA SZ (Bone Implant) ×2 IMPLANT
PENCIL SMOKE EVACUATOR COATED (MISCELLANEOUS) ×2 IMPLANT
PULSAVAC PLUS IRRIG FAN TIP (DISPOSABLE) ×2
SCALPEL PROTECTED #10 DISP (BLADE) ×4 IMPLANT
SOL .9 NS 3000ML IRR  AL (IV SOLUTION) ×1
SOL .9 NS 3000ML IRR UROMATIC (IV SOLUTION) ×1 IMPLANT
STAPLER SKIN PROX 35W (STAPLE) ×2 IMPLANT
STEM EXTENSION 11MMX30MM (Stem) ×2 IMPLANT
SUCTION FRAZIER HANDLE 10FR (MISCELLANEOUS) ×1
SUCTION TUBE FRAZIER 10FR DISP (MISCELLANEOUS) ×1 IMPLANT
SUT DVC 2 QUILL PDO  T11 36X36 (SUTURE) ×1
SUT DVC 2 QUILL PDO T11 36X36 (SUTURE) ×1 IMPLANT
SUT ETHIBOND 2 V 37 (SUTURE) IMPLANT
SUT V-LOC 90 ABS DVC 3-0 CL (SUTURE) ×2 IMPLANT
SYR 20ML LL LF (SYRINGE) ×2 IMPLANT
SYR 50ML LL SCALE MARK (SYRINGE) ×4 IMPLANT
TIBIAL TRAY FIXED 4 MEDACTA 02 (Joint) ×2 IMPLANT
TIP FAN IRRIG PULSAVAC PLUS (DISPOSABLE) ×1 IMPLANT
TOWEL OR 17X26 4PK STRL BLUE (TOWEL DISPOSABLE) ×2 IMPLANT
TOWER CARTRIDGE SMART MIX (DISPOSABLE) ×2 IMPLANT
TRAY FOLEY MTR SLVR 16FR STAT (SET/KITS/TRAYS/PACK) ×2 IMPLANT
WRAPON POLAR PAD KNEE (MISCELLANEOUS) ×2

## 2021-02-14 NOTE — Transfer of Care (Signed)
Immediate Anesthesia Transfer of Care Note  Patient: Douglas Anthony  Procedure(s) Performed: TOTAL KNEE ARTHROPLASTY - [place] Neighbor to Assist (Right Knee)  Patient Location: PACU  Anesthesia Type:Spinal  Level of Consciousness: awake and alert   Airway & Oxygen Therapy: Patient Spontanous Breathing and Patient connected to nasal cannula oxygen  Post-op Assessment: Report given to RN and Post -op Vital signs reviewed and stable  Post vital signs: Reviewed and stable  Last Vitals:  Vitals Value Taken Time  BP 112/85 02/14/21 0934  Temp    Pulse 78 02/14/21 0936  Resp 14 02/14/21 0936  SpO2 98 % 02/14/21 0936  Vitals shown include unvalidated device data.  Last Pain:  Vitals:   02/14/21 0998  TempSrc: Tympanic  PainSc: 0-No pain      Patients Stated Pain Goal: 0 (02/14/21 3382)  Complications: No complications documented.

## 2021-02-14 NOTE — Anesthesia Procedure Notes (Signed)
Spinal  Patient location during procedure: OR Start time: 02/14/2021 7:22 AM End time: 02/14/2021 7:25 AM Staffing Performed: resident/CRNA  Anesthesiologist: Martha Clan, MD Resident/CRNA: Demetrius Charity, CRNA Preanesthetic Checklist Completed: patient identified, IV checked, site marked, risks and benefits discussed, surgical consent, monitors and equipment checked, pre-op evaluation and timeout performed Spinal Block Patient position: sitting Prep: Betadine Patient monitoring: heart rate, continuous pulse ox, blood pressure and cardiac monitor Approach: midline Location: L4-5 Injection technique: single-shot Needle Needle type: Whitacre and Introducer  Needle gauge: 24 G Needle length: 9 cm Additional Notes Negative paresthesia. Negative blood return. Positive free-flowing CSF. Expiration date of kit checked and confirmed. Patient tolerated procedure well, without complications.

## 2021-02-14 NOTE — Evaluation (Signed)
Physical Therapy Evaluation Patient Details Name: Douglas Anthony MRN: [id number] DOB: [birth year] Today's Date: 02/14/2021   History of Present Illness  Primary localized osteoarthritis of right knee s/p right TKA 02/14/21. Past medical history includes popliteal artery bypass grafting Left, DVT, hypertension, aneurysm, anxiety, arthritis, sleep apnea, diabetes, hyperlipidemia  Clinical Impression  PT evaluation completed. Patient reports no pain in right knee. Patient was able to stand and ambulate a short distance with rolling walker with no significant increased pain reported. Patient participated with strengthening exercises for RLE in sitting and supine position with exercise packet provided. Patient stood for several minutes with rolling walker for support with supervision, no loss of balance, and requested to return to bed at end of session. Educated patient on positioning instructions for right knee while in bed. Patient has supportive spouse who will be available a home as needed. Recommend PT follow up to maximize independence in preparation for home discharge. HHPT recommended.     Follow Up Recommendations Home health PT;Supervision - Intermittent    Equipment Recommendations  Rolling walker with 5" wheels    Recommendations for Other Services       Precautions / Restrictions Precautions Precautions: Fall;Knee Precaution Booklet Issued: Yes (comment) Restrictions Weight Bearing Restrictions: Yes RLE Weight Bearing: Weight bearing as tolerated      Mobility  Bed Mobility Overal bed mobility: Modified Independent             General bed mobility comments: verbal cues for technique    Transfers Overall transfer level: Needs assistance Equipment used: Rolling walker (2 wheeled) Transfers: Sit to/from Stand Sit to Stand: Min guard         General transfer comment: verbal cues for hand placement to safely stand and cues for RLE positioning with  sitting  Ambulation/Gait Ambulation/Gait assistance: Min guard Gait Distance (Feet): 5 Feet Assistive device: Rolling walker (2 wheeled) Gait Pattern/deviations: Step-to pattern;Decreased stance time - right Gait velocity: decreased   General Gait Details: patient able to take several steps with rolling walker in room. cues for sequencing  Stairs            Wheelchair Mobility    Modified Rankin (Stroke Patients Only)       Balance Overall balance assessment: Needs assistance Sitting-balance support: Feet supported;No upper extremity supported Sitting balance-Leahy Scale: Good     Standing balance support: Bilateral upper extremity supported Standing balance-Leahy Scale: Fair Standing balance comment: patient relying on rolling walker for support in standing position                             Pertinent Vitals/Pain Pain Assessment: No/denies pain    Home Living Family/patient expects to be discharged to:: Private residence Living Arrangements: Spouse/significant other Available Help at Discharge: Family;Available 24 hours/day Type of Home: House Home Access: Stairs to enter Entrance Stairs-Rails:  (one rail) Secretary/administrator of Steps: 5 Home Layout: One level Home Equipment: Cane - single point;Shower seat Additional Comments: patient reports he has an old walker out in the building    Prior Function Level of Independence: Independent with assistive device(s)         Comments: Recent use of cane for ambulation outside of house, otherwise independent with activity. Patient works as a Printmaker at [employer]        Extremity/Trunk Assessment   Upper Extremity Assessment Upper Extremity Assessment: Overall [facility] for tasks assessed  Lower Extremity Assessment Lower Extremity Assessment: RLE deficits/detail (LLW WNL for functional activity) RLE Deficits / Details: patient able to perform SLR x 10 reps  independently. ankle AROM WFL RLE Sensation: WNL       Communication   Communication: No difficulties  Cognition Arousal/Alertness: Awake/alert Behavior During Therapy: WFL for tasks assessed/performed Overall Cognitive Status: Within Functional Limits for tasks assessed                                        General Comments      Exercises Total Joint Exercises Ankle Circles/Pumps: AROM;Strengthening;Right;10 reps;Supine Quad Sets: AROM;Strengthening;Right;5 reps;Supine Straight Leg Raises: AROM;Strengthening;Right;10 reps;Supine Long Arc Quad: AAROM;Strengthening;Right;10 reps;Seated (AAROM only required for end ROM for to promote knee extension) Goniometric ROM: right knee active ROM 0-84 degrees   Assessment/Plan    PT Assessment Patient needs continued PT services  PT Problem List Decreased strength;Decreased range of motion;Decreased balance;Decreased mobility;Decreased knowledge of use of DME;Decreased safety awareness;Decreased knowledge of precautions       PT Treatment Interventions DME instruction;Gait training;Stair training;Functional mobility training;Therapeutic activities;Therapeutic exercise;Balance training;Patient/family education    PT Goals (Current goals can be found in the Care Plan section)  Acute Rehab PT Goals Patient Stated Goal: to go home and eventually return to work PT Goal Formulation: With patient Time For Goal Achievement: 02/28/21 Potential to Achieve Goals: Good Additional Goals Additional Goal #1: patient will increase right knee flexion to 90 degrees for improved gait pattern    Frequency BID   Barriers to discharge        Co-evaluation               AM-PAC PT "6 Clicks" Mobility  Outcome Measure Help needed turning from your back to your side while in a flat bed without using bedrails?: None Help needed moving from lying on your back to sitting on the side of a flat bed without using bedrails?:  None Help needed moving to and from a bed to a chair (including a wheelchair)?: A Little Help needed standing up from a chair using your arms (e.g., wheelchair or bedside chair)?: A Little Help needed to walk in hospital room?: A Little Help needed climbing 3-5 steps with a railing? : A Little 6 Click Score: 20    End of Session Equipment Utilized During Treatment: Gait belt Activity Tolerance: Patient tolerated treatment well Patient left: in bed;with call bell/phone within reach;with bed alarm set;with family/visitor present;with SCD's reapplied (polar care reapplied right knee) Nurse Communication: Mobility status PT Visit Diagnosis: Other abnormalities of gait and mobility (R26.89);Muscle weakness (generalized) (M62.81)    Time: 1351-1430 PT Time Calculation (min) (ACUTE ONLY): 39 min   Charges:   PT Evaluation $PT Eval Moderate Complexity: 1 Mod PT Treatments $Therapeutic Exercise: 8-22 mins $Therapeutic Activity: 8-22 mins        Donna Bernard, PT, MPT   Ina Homes 02/14/2021, 3:15 PM

## 2021-02-14 NOTE — Op Note (Signed)
02/14/2021  9:35 AM  PATIENT:  Douglas Anthony   MRN: [id number]  PRE-OPERATIVE DIAGNOSIS:  Primary localized osteoarthritis of right knee   POST-OPERATIVE DIAGNOSIS:  Same   PROCEDURE:  Procedure(s): Right TOTAL KNEE ARTHROPLASTY   SURGEON: Leitha Schuller, MD   ASSISTANTS: Cranston Neighbor, PA-C   ANESTHESIA:   spinal   EBL:   100   BLOOD ADMINISTERED:none   DRAINS: Incisional wound VAC    LOCAL MEDICATIONS USED:  MARCAINE    and OTHER Exparel morphine and Toradol   SPECIMEN:  No Specimen   DISPOSITION OF SPECIMEN:  N/A   COUNTS:  YES   TOURNIQUET:   34 at 300 mm Hg   IMPLANTS: Medacta  GMK sphere system with  right 4 femur, right 4 tibia with short stem and  10 mm insert.  Size  3 patella, all components cemented.   DICTATION: Reubin Milan Dictation   patient was brought to the operating room and spinal anesthesia was obtained.  After prepping and draping the  right leg in sterile fashion, and after patient identification and timeout procedures were completed, tourniquet was raised  and midline skin incision was made followed by medial parapatellar arthrotomy with  severe medial compartment osteoarthritis, severe patellofemoral arthritis and  mild lateral compartment arthritis, partial synovectomy was also carried out.   The ACL and PCL and fat pad were excised along with anterior horns of the meniscus. The proximal tibia cutting guide from  the [facility] system was applied and the proximal tibia cut carried out.  The distal femoral cut was carried out in a similar fashion     The  for femoral cutting guide applied with anterior posterior and chamfer cuts made.  The posterior horns of the menisci were removed at this point.   Injection of the above medication was carried out after the femoral and tibial cuts were carried out.  The  for baseplate trial was placed pinned into position and proximal tibial preparation carried out with drilling hand reaming and the keel punch followed by  placement of the  for femur and sizing the tibial insert size   10 millimeter gave the best fit with stability and full extension.  The distal femoral drill holes were made in the notch cut for the trochlear groove was then carried out with trials were then removed the patella was cut using the patellar cutting guide and it sized to a size  3 after drill holes have been made  The knee was irrigated with pulsatile lavage and the bony surfaces dried the tibial component was cemented into place first.  Excess cement was removed and the polyethylene insert placed with a torque screw placed with a torque screwdriver tightened.  The distal femoral component was placed and the knee was held in extension as the patellar button was clamped into place.  After the cement was set, excess cement was removed and the knee was again irrigated thoroughly thoroughly irrigated.  The tourniquet was let down and hemostasis checked with electrocautery. The arthrotomy was repaired with a heavy Quill suture,  followed by 3-0 V lock subcuticular closure, skin staples followed by incisional wound VAC and Polar Care.Marland Kitchen   PLAN OF CARE: Admit to inpatient    PATIENT DISPOSITION:  PACU - hemodynamically stable.

## 2021-02-14 NOTE — Anesthesia Preprocedure Evaluation (Signed)
Anesthesia Evaluation  Patient identified by MRN, date of birth, ID band Patient awake    Reviewed: Allergy & Precautions, H&P , NPO status , Patient's Chart, lab work & pertinent test results, reviewed documented beta blocker date and time   History of Anesthesia Complications (+) PONV and history of anesthetic complications  Airway Mallampati: III  TM Distance: >3 FB Neck ROM: full    Dental  (+) Dental Advidsory Given, Poor Dentition   Pulmonary neg shortness of breath, sleep apnea (based on history) , neg COPD, neg recent URI, Current Smoker,    Pulmonary exam normal breath sounds clear to auscultation       Cardiovascular Exercise Tolerance: Good hypertension, (-) angina(-) Past MI and (-) Cardiac Stents Normal cardiovascular exam(-) dysrhythmias (-) Valvular Problems/Murmurs Rhythm:regular Rate:Normal     Neuro/Psych PSYCHIATRIC DISORDERS Anxiety negative neurological ROS     GI/Hepatic negative GI ROS, Neg liver ROS,   Endo/Other  diabetes  Renal/GU negative Renal ROS  negative genitourinary   Musculoskeletal   Abdominal   Peds  Hematology negative hematology ROS (+)   Anesthesia Other Findings Past Medical History: No date: Aneurysm of leg, right (HCC) No date: Anxiety No date: Arthritis No date: Blood clot in vein     Comment:  left leg; Tx Oral medication No date: Diabetes mellitus without complication (HCC) No date: Hyperlipemia No date: Hypertension No date: PONV (postoperative nausea and vomiting)   Reproductive/Obstetrics negative OB ROS                             Anesthesia Physical Anesthesia Plan  ASA: III  Anesthesia Plan: Spinal   Post-op Pain Management:    Induction: Intravenous  PONV Risk Score and Plan: 2 and TIVA and Propofol infusion  Airway Management Planned: Natural Airway and Simple Face Mask  Additional Equipment:   Intra-op Plan:    Post-operative Plan:   Informed Consent: I have reviewed the patients History and Physical, chart, labs and discussed the procedure including the risks, benefits and alternatives for the proposed anesthesia with the patient or authorized representative who has indicated his/her understanding and acceptance.     Dental Advisory Given  Plan Discussed with: Anesthesiologist, CRNA and Surgeon  Anesthesia Plan Comments:         Anesthesia Quick Evaluation

## 2021-02-14 NOTE — H&P (Signed)
Patient presents with  . Pre-op Exam  scheduled for Right TKA 02/14/21    History of the Present Illness: Douglas Anthony is a 60 y.o. male here today for history and physical for right total knee arthroplasty with Dr. Kennedy Bucker on 02/14/2021. Patient has x-ray showing severe degenerative changes in the medial compartment of the right knee. He has complete loss of joint space in the medial compartment with varus deformity. Pain has been progressively getting worse for several years. He was scheduled to have total knee arthroplasty several months ago but has been put off due to Covid. The pain is to the point where he can barely walk. He is using a cane. He is taken over-the-counter medications with little relief. Pain has significantly interfered with his ability to work and perform activities of daily living. His MyKnee CT, significant erosion of bone in all 3 compartments, as well as a severe varus deformity.  The patient takes metformin for diabetes. His primary care provider is Dewaine Oats in [place]. He reports his blood glucose was up at his last visit.   The patient is employed as a Merchandiser, retail at [employer].  I have reviewed past medical, surgical, social and family history, and allergies as documented in the EMR.  Past Medical History: Past Medical History:  Diagnosis Date  . Allergy  . Aneurysm (CMS-HCC)  . Anxiety  . Arthritis  . 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 GERD (gastroesophageal reflux disease)  . Hyperlipidemia  . Hypertension   Past Surgical History: Past Surgical History:  Procedure Laterality Date  . Popliteal artery bypass grafting Left   Past Family History: History reviewed. No pertinent family history.  Medications: Current Outpatient Medications Ordered in Epic  Medication Sig Dispense Refill  . amLODIPine (NORVASC) 10 MG tablet Take 10 mg by mouth once daily  . AMLODIPINE BESYLATE (AMLODIPINE ORAL) Take by mouth.   Marland Kitchen aspirin 81 MG EC tablet Take by mouth  . ASPIRIN ORAL Take by mouth.  Marland Kitchen atorvastatin (LIPITOR) 20 MG tablet Take 20 mg by mouth once daily  . ATORVASTATIN CALCIUM (ATORVASTATIN ORAL) Take by mouth.  . etodolac (LODINE) 500 MG tablet Take 1 tablet (500 mg total) by mouth 2 (two) times daily. 30 tablet 0  . levocetirizine (XYZAL) 5 MG tablet  . LORazepam (ATIVAN) 1 MG tablet Take by mouth.  . metFORMIN (GLUCOPHAGE) 500 MG tablet Take 500 mg by mouth 2 (two) times daily with meals  . METFORMIN HCL (METFORMIN ORAL) Take by mouth.  . QUINAPRIL HCL (QUINAPRIL ORAL) Take by mouth.  . predniSONE (DELTASONE) 10 MG tablet 6 PO Q D X 1 DAY, THEN 5 PO Q D X 1 DAY, THEN 4 PO Q D X 1 DAY, THEN 3 PO Q D X 1 DAY, THEN 2 PO Q D X 1 DAY, THEN 1 PO Q D X 1 DAY (Patient not taking: Reported on 02/10/2021 ) 21 tablet 0   No current Epic-ordered facility-administered medications on file.   Allergies: No Known Allergies   Body mass index is 33.3 kg/m.  Review of Systems: A comprehensive 14 point ROS was performed, reviewed, and the pertinent orthopaedic findings are documented in the HPI.  Vitals:  02/10/21 0930  BP: 138/88    General Physical Examination:   General:  Well developed, well nourished, no apparent distress, normal affect, antalgic gait with a cane  HEENT: Head normocephalic, atraumatic, PERRL.   Abdomen: Soft, non tender, non distended,  Bowel sounds present.  Heart: Examination of the heart reveals regular, rate, and rhythm. There is no murmur noted on ascultation. There is a normal apical pulse.  Lungs: Lungs are clear to auscultation. There is no wheeze, rhonchi, or crackles. There is normal expansion of bilateral chest walls.   Musculoskeletal Examination:  Examination of the right knee shows 5 to 110 degrees range of motion. No laxity valgus varus stress testing. Varus deformity noted. Tender along the medial joint line and nontender along the lateral joint line. No  swelling or edema throughout the lower leg. He is able to straight leg raise. Patella tracks well. No groin pain or lateral hip pain with hip internal ex rotation. Very little stiffness with hip range of motion.  Radiographs:  X-rays of the right knee reviewed by me today show complete loss of joint space in the medial compartment with moderate varus deformity. No evidence of acute bony abnormality.  Assessment: ICD-10-CM  1. Primary osteoarthritis of right knee M17.11   Plan: 1. Risks, benefits, complications of a right total knee arthroplasty have been discussed with the patient. Patient has agreed and consented procedure with Dr. Kennedy Bucker on 02/14/2021. Concerns and questions were addressed today in the office.    Electronically signed by Patience Musca, PA at 02/10/2021 9:53 AM EST    Reviewed  H+P. No changes noted.

## 2021-02-14 NOTE — Evaluation (Signed)
Occupational Therapy Evaluation Patient Details Name: Douglas Anthony MRN: [id number] DOB: 1961/11/22 Today's Date: 02/14/2021    History of Present Illness Primary localized osteoarthritis of right knee s/p right TKA 02/14/21. Past medical history includes popliteal artery bypass grafting Left, DVT, hypertension, aneurysm, anxiety, arthritis, sleep apnea, diabetes, hyperlipidemia   Clinical Impression   Pt seen for OT evaluation this date, POD#0 from above surgery. Pt was independent in all ADL prior to surgery and working, however occasionally using SPC for mobility outside the home due to R knee pain. Pt is eager to return to PLOF with less pain and improved safety and independence. Pt currently requires PRN MIN A for LB dressing and bathing while in seated position due to pain and limited AROM of R knee. Pt instructed in polar care mgt, falls prevention strategies, home/routines modifications, pet care considerations, DME/AE for LB bathing and dressing tasks, and compression stocking mgt. Handout provided to support recall and carryover. Pt would benefit from skilled OT services including additional instruction in dressing techniques with or without assistive devices for dressing and bathing skills to support recall and carryover prior to discharge and ultimately to maximize safety, independence, and minimize falls risk and caregiver burden. Do not currently anticipate any OT needs following this hospitalization.      Follow Up Recommendations  No OT follow up    Equipment Recommendations  3 in 1 bedside commode    Recommendations for Other Services       Precautions / Restrictions Precautions Precautions: Fall;Knee Precaution Booklet Issued: Yes (comment) Restrictions Weight Bearing Restrictions: Yes RLE Weight Bearing: Weight bearing as tolerated      Mobility Bed Mobility Overal bed mobility: Modified Independent             General bed mobility comments: verbal cues for  technique    Transfers Overall transfer level: Needs assistance Equipment used: Rolling walker (2 wheeled) Transfers: Sit to/from Stand Sit to Stand: Min guard         General transfer comment: verbal cues for hand placement to safely stand and cues for RLE positioning with sitting    Balance Overall balance assessment: Needs assistance Sitting-balance support: Feet supported;No upper extremity supported Sitting balance-Leahy Scale: Good     Standing balance support: Bilateral upper extremity supported Standing balance-Leahy Scale: Fair Standing balance comment: patient relying on rolling walker for support in standing position                           ADL either performed or assessed with clinical judgement   ADL Overall ADL's : Needs assistance/impaired                                       General ADL Comments: PRN Min A for LB ADL tasks, Supervision for ADL transfers     Vision Baseline Vision/History: Wears glasses Wears Glasses: At all times Patient Visual Report: No change from baseline       Perception     Praxis      Pertinent Vitals/Pain Pain Assessment: 0-10 Pain Score: 7  Pain Location: R knee Pain Descriptors / Indicators: Aching;Sore Pain Intervention(s): Limited activity within patient's tolerance;Monitored during session;Repositioned;Ice applied;Patient requesting pain meds-RN notified     Hand Dominance Right   Extremity/Trunk Assessment Upper Extremity Assessment Upper Extremity Assessment: Overall WFL for tasks assessed   Lower Extremity  Assessment Lower Extremity Assessment: RLE deficits/detail RLE Deficits / Details: patient able to perform SLR x 10 reps independently. ankle AROM WFL RLE Sensation: WNL       Communication Communication Communication: No difficulties   Cognition Arousal/Alertness: Awake/alert Behavior During Therapy: WFL for tasks assessed/performed Overall Cognitive Status: Within  Functional Limits for tasks assessed                                     General Comments       Exercises Exercises: Total Joint Total Joint Exercises Ankle Circles/Pumps: AROM;Strengthening;Right;10 reps;Supine Quad Sets: AROM;Strengthening;Right;5 reps;Supine Straight Leg Raises: AROM;Strengthening;Right;10 reps;Supine Long Arc Quad: AAROM;Strengthening;Right;10 reps;Seated (AAROM only required for end ROM for to promote knee extension) Goniometric ROM: right knee active ROM 0-84 degrees Other Exercises Other Exercises: Pt instructed in falls prevention, ,pet care considerations, home/routines modifications, AE/DME, self care skills, polar care mgt, and compression stocking mgt; handout provided to support recall and carryover   Shoulder Instructions      Home Living Family/patient expects to be discharged to:: Private residence Living Arrangements: Spouse/significant other Available Help at Discharge: Family;Available 24 hours/day Type of Home: House Home Access: Stairs to enter [employer] of Steps: 5 Entrance Stairs-Rails:  (one rail) Home Layout: One level     Bathroom Shower/Tub: Producer, television/film/video: Standard     Home Equipment: Cane - single point;Shower seat   Additional Comments: patient reports he has an old walker out in the building      Prior Functioning/Environment Level of Independence: Independent with assistive device(s)        Comments: Recent use of cane for ambulation outside of house, otherwise independent with activity. Patient works as a Printmaker at [employer]        OT Problem List: Decreased strength;Pain;Decreased range of motion;Decreased knowledge of use of DME or AE      OT Treatment/Interventions: Self-care/ADL training;Therapeutic exercise;Therapeutic activities;DME and/or AE instruction;Patient/family education    OT Goals(Current goals can be found in the care plan section)  Acute Rehab OT Goals Patient Stated Goal: to go home and eventually return to work OT Goal Formulation: With patient Time For Goal Achievement: 02/28/21 Potential to Achieve Goals: Good ADL Goals Pt Will Perform Lower Body Dressing: with modified independence;sit to/from stand Pt Will Transfer to Toilet: with modified independence;ambulating;regular height toilet (LRAD for amb) Additional ADL Goal #1: Pt will independently instruct family/caregiver in compression stocking mgt Additional ADL Goal #2: Pt will independently instruct family/caregiver in polar care mgt  OT Frequency: Min 1X/week   Barriers to D/C:            Co-evaluation              AM-PAC OT "6 Clicks" Daily Activity     Outcome Measure Help from another person eating meals?: None Help from another person taking care of personal grooming?: None Help from another person toileting, which includes using toliet, bedpan, or urinal?: A Little Help from another person bathing (including washing, rinsing, drying)?: A Little Help from another person to put on and taking off regular upper body clothing?: None Help from another person to put on and taking off regular lower body clothing?: A Little 6 Click Score: 21   End of Session Nurse Communication: Patient requests pain meds  Activity Tolerance: Patient tolerated treatment well Patient left: in bed;with call bell/phone within reach;with bed alarm  set;Other (comment) (rolled towel under R ankle)  OT Visit Diagnosis: Other abnormalities of gait and mobility (R26.89);Pain Pain - Right/Left: Right Pain - part of body: Knee                Time: 0350-0938 OT Time Calculation (min): 14 min Charges:  OT General Charges $OT Visit: 1 Visit OT Evaluation $OT Eval Low Complexity: 1 Low OT Treatments $Self Care/Home Management : 8-22 mins  Wynona Canes, MPH, MS, OTR/L ascom [phone] 02/14/21, 4:26 PM

## 2021-02-15 ENCOUNTER — Encounter: Payer: Self-pay | Admitting: Orthopedic Surgery

## 2021-02-15 LAB — GLUCOSE, CAPILLARY
Glucose-Capillary: 143 mg/dL — ABNORMAL HIGH (ref 70–99)
Glucose-Capillary: 156 mg/dL — ABNORMAL HIGH (ref 70–99)
Glucose-Capillary: 124 mg/dL — ABNORMAL HIGH (ref 70–99)
Glucose-Capillary: 155 mg/dL — ABNORMAL HIGH (ref 70–99)

## 2021-02-15 LAB — CBC
HCT: 33.9 % — ABNORMAL LOW (ref 39.0–52.0)
Hemoglobin: 11.8 g/dL — ABNORMAL LOW (ref 13.0–17.0)
MCH: 32.1 pg (ref 26.0–34.0)
MCHC: 34.8 g/dL (ref 30.0–36.0)
MCV: 92.1 fL (ref 80.0–100.0)
Platelets: 174 10*3/uL (ref 150–400)
RBC: 3.68 MIL/uL — ABNORMAL LOW (ref 4.22–5.81)
RDW: 11.6 % (ref 11.5–15.5)
WBC: 9.1 10*3/uL (ref 4.0–10.5)
nRBC: 0 % (ref 0.0–0.2)

## 2021-02-15 LAB — BASIC METABOLIC PANEL
Anion gap: 6 (ref 5–15)
BUN: 11 mg/dL (ref 8–23)
CO2: 25 mmol/L (ref 22–32)
Calcium: 8.1 mg/dL — ABNORMAL LOW (ref 8.9–10.3)
Chloride: 102 mmol/L (ref 98–111)
Creatinine, Ser: 0.9 mg/dL (ref 0.61–1.24)
GFR, Estimated: >60 mL/min (ref >60)
Glucose, Bld: 149 mg/dL — ABNORMAL HIGH (ref 70–99)
Potassium: 5.1 mmol/L (ref 3.5–5.1)
Sodium: 133 mmol/L — ABNORMAL LOW (ref 135–145)

## 2021-02-15 MED ORDER — MAGNESIUM HYDROXIDE 400 MG/5ML PO SUSP
30.0000 mL | Freq: Once | ORAL | Status: AC
  Administered 2021-02-15: 30 mL via ORAL
  Filled 2021-02-15: qty 30

## 2021-02-15 NOTE — Plan of Care (Signed)
Problem: Education: Goal: Knowledge of General Education information will improve Description: Including pain rating scale, medication(s)/side effects and non-pharmacologic comfort measures 02/15/2021 1142 by Jean Rosenthal, RN Outcome: Progressing 02/15/2021 0839 by Jean Rosenthal, RN Outcome: Progressing 02/15/2021 0831 by Jean Rosenthal, RN Outcome: Progressing   Problem: Health Behavior/Discharge Planning: Goal: Ability to manage health-related needs will improve 02/15/2021 1142 by Jean Rosenthal, RN Outcome: Progressing 02/15/2021 0839 by Jean Rosenthal, RN Outcome: Progressing 02/15/2021 0831 by Jean Rosenthal, RN Outcome: Progressing   Problem: Clinical Measurements: Goal: Ability to maintain clinical measurements within normal limits will improve 02/15/2021 1142 by Jean Rosenthal, RN Outcome: Progressing 02/15/2021 0839 by Jean Rosenthal, RN Outcome: Progressing 02/15/2021 0831 by Jean Rosenthal, RN Outcome: Progressing Goal: Will remain free from infection 02/15/2021 1142 by Jean Rosenthal, RN Outcome: Progressing 02/15/2021 0839 by Jean Rosenthal, RN Outcome: Progressing 02/15/2021 0831 by Jean Rosenthal, RN Outcome: Progressing Goal: Diagnostic test results will improve 02/15/2021 1142 by Jean Rosenthal, RN Outcome: Progressing 02/15/2021 0839 by Jean Rosenthal, RN Outcome: Progressing 02/15/2021 0831 by Jean Rosenthal, RN Outcome: Progressing Goal: Respiratory complications will improve 02/15/2021 1142 by Jean Rosenthal, RN Outcome: Progressing 02/15/2021 0839 by Jean Rosenthal, RN Outcome: Progressing 02/15/2021 0831 by Jean Rosenthal, RN Outcome: Progressing Goal: Cardiovascular complication will be avoided 02/15/2021 1142 by Jean Rosenthal, RN Outcome: Progressing 02/15/2021 0839 by Jean Rosenthal, RN Outcome: Progressing 02/15/2021 0831 by Jean Rosenthal, RN Outcome: Progressing   Problem: Activity: Goal: Risk for  activity intolerance will decrease 02/15/2021 1142 by Jean Rosenthal, RN Outcome: Progressing 02/15/2021 0839 by Jean Rosenthal, RN Outcome: Progressing 02/15/2021 0831 by Jean Rosenthal, RN Outcome: Progressing   Problem: Nutrition: Goal: Adequate nutrition will be maintained 02/15/2021 1142 by Jean Rosenthal, RN Outcome: Progressing 02/15/2021 0839 by Jean Rosenthal, RN Outcome: Progressing 02/15/2021 0831 by Jean Rosenthal, RN Outcome: Progressing   Problem: Coping: Goal: Level of anxiety will decrease 02/15/2021 1142 by Jean Rosenthal, RN Outcome: Progressing 02/15/2021 0839 by Jean Rosenthal, RN Outcome: Progressing 02/15/2021 0831 by Jean Rosenthal, RN Outcome: Progressing   Problem: Elimination: Goal: Will not experience complications related to bowel motility 02/15/2021 1142 by Jean Rosenthal, RN Outcome: Progressing 02/15/2021 0839 by Jean Rosenthal, RN Outcome: Progressing 02/15/2021 0831 by Jean Rosenthal, RN Outcome: Progressing Goal: Will not experience complications related to urinary retention 02/15/2021 1142 by Jean Rosenthal, RN Outcome: Progressing 02/15/2021 0839 by Jean Rosenthal, RN Outcome: Progressing 02/15/2021 0831 by Jean Rosenthal, RN Outcome: Progressing   Problem: Pain Managment: Goal: General experience of comfort will improve 02/15/2021 1142 by Jean Rosenthal, RN Outcome: Progressing 02/15/2021 0839 by Jean Rosenthal, RN Outcome: Progressing 02/15/2021 0831 by Jean Rosenthal, RN Outcome: Progressing   Problem: Safety: Goal: Ability to remain free from injury will improve 02/15/2021 1142 by Jean Rosenthal, RN Outcome: Progressing 02/15/2021 0839 by Jean Rosenthal, RN Outcome: Progressing 02/15/2021 0831 by Jean Rosenthal, RN Outcome: Progressing   Problem: Skin Integrity: Goal: Risk for impaired skin integrity will decrease 02/15/2021 1142 by Jean Rosenthal, RN Outcome: Progressing 02/15/2021 0839 by  Jean Rosenthal, RN Outcome: Progressing 02/15/2021 0831 by Jean Rosenthal, RN Outcome: Progressing   Problem: Education: Goal: Knowledge of the prescribed therapeutic regimen will improve 02/15/2021 1142 by Jean Rosenthal, RN Outcome: Progressing 02/15/2021 0839 by Jean Rosenthal,  RN Outcome: Progressing 02/15/2021 0831 by Jean Rosenthal, RN Outcome: Progressing Goal: Individualized Educational Video(s) 02/15/2021 1142 by Jean Rosenthal, RN Outcome: Progressing 02/15/2021 0839 by Jean Rosenthal, RN Outcome: Progressing 02/15/2021 0831 by Jean Rosenthal, RN Outcome: Progressing

## 2021-02-15 NOTE — TOC Initial Note (Signed)
Transition of Care [facility]) - Initial/Assessment Note    Patient Details  Name: Douglas Anthony MRN: [id number] Date of Birth: 12/19/1960  Transition of Care [facility] CM/SW Contact:    Caryn Section, RN Phone Number: 02/15/2021, 12:04 PM  Clinical Narrative:       TOC at bedside, introductions made to patient and wife.  Patient gave verbal permission to speak about discharge planning with wife.  Lives at home with wife, who will participate in care and transport to appointments.  No concerns with getting prescriptions from outpatient pharmacy.  Recommendations are home with home health PT-previously arranged by [facility] clinic, patient and spouse made aware.  No DME needs at this time, patient has walker, shower chair, toilet seat at home currently.  Patient and spouse had no questions or concerns at this time, given contact information for TOC.              Expected Discharge Plan: Home w Home Health Services Barriers to Discharge: No Barriers Identified   Patient Goals and CMS Choice     Choice offered to / list presented to : NA  Expected Discharge Plan and Services Expected Discharge Plan: Home w Home Health Services       Living arrangements for the past 2 months: Single Family Home                   DME Agency:  (Patient states he has equipment at home, no DME needs)               Representative spoke with at [facility] Agency: Home Health arranged through [facility] prior to admission, patient and family aware.  Prior Living Arrangements/Services Living arrangements for the past 2 months: Single Family Home Lives with:: Spouse Patient language and need for interpreter reviewed:: Yes Do you feel safe going back to the place where you live?: Yes      Need for Family Participation in Patient Care: Yes (Comment) Care giver support system in place?: Yes (comment)   Criminal Activity/Legal Involvement Pertinent to Current Situation/Hospitalization: No - Comment as  needed  Activities of Daily Living      Permission Sought/Granted   Permission granted to share information with : Yes, Verbal Permission Granted  Share Information with NAME: Wife-Sandra Edelen-at bedside           Emotional Assessment Appearance:: Appears stated age Attitude/Demeanor/Rapport: Engaged,Gracious Affect (typically observed): Appropriate,Calm,Pleasant Orientation: : Oriented to Self,Oriented to Place,Oriented to  Time,Oriented to Situation Alcohol / Substance Use: Not Applicable Psych Involvement: No (comment)  Admission diagnosis:  S/P TKR (total knee replacement) using cement, right [Z96.651] Patient Active Problem List   Diagnosis Date Noted  . S/P TKR (total knee replacement) using cement, right 02/14/2021  . 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 Knee pain, left 10/05/2015   PCP:  Jaclyn Shaggy, MD Pharmacy:   [facility] - [place], [place] - [address] [place] [address] Phone: [phone] Fax: [phone]     Social Determinants of Health (SDOH) Interventions    Readmission Risk Interventions No flowsheet data found.

## 2021-02-15 NOTE — Progress Notes (Signed)
Physical Therapy Treatment Patient Details Name: Douglas Anthony MRN: [id number] DOB: September 05, 1961 Today's Date: 02/15/2021    History of Present Illness Primary localized osteoarthritis of right knee s/p right TKA 02/14/21. Past medical history includes popliteal artery bypass grafting Left, DVT, hypertension, aneurysm, anxiety, arthritis, sleep apnea, diabetes, hyperlipidemia    PT Comments    Pt was long sitting in bed upon arriving for 2nd session. He agrees to session and is cooperative throughout. Did receive pain medication ~ 309 minutes prior however continues to endorse increased pain from AM session. Pt was still able to exit bed, stand and ambulate 250 + ft without difficulty.Also was able to perform AROM to R knee 1-90 degrees in sitting . Reviewed HEP handout and pt performed. He requested to attempt to have BM. Pt ambulated in bathroom and sat on elevated toilet seat height. RN/RN tech aware. Pt will pull call string once finished. Acute PT recommend DC home with HHPT to follow to address deficits with ROM/strength/and overall safety with ADLs.    Follow Up Recommendations  Home health PT     Equipment Recommendations  Rolling walker with 5" wheels       Precautions / Restrictions Precautions Precautions: Fall;Knee Precaution Booklet Issued: Yes (comment) Restrictions Weight Bearing Restrictions: Yes RLE Weight Bearing: Weight bearing as tolerated    Mobility  Bed Mobility Overal bed mobility: Modified Independent       Transfers Overall transfer level: Needs assistance Equipment used: Rolling walker (2 wheeled) Transfers: Sit to/from Stand Sit to Stand: Min guard         General transfer comment: vcs for hips to EOB and increased fwd wt shift prior to standing.  Ambulation/Gait Ambulation/Gait assistance: Supervision Gait Distance (Feet): 250 Feet Assistive device: Rolling walker (2 wheeled) Gait Pattern/deviations: Step-through pattern Gait velocity: WNL    General Gait Details: no LOB or difficulty ambulating 250 ft. did cue pt for improved heel strike and stay inside RW   Stairs Stairs: Yes Stairs assistance: Supervision Stair Management: One rail Left;Step to pattern;Forwards Number of Stairs: 4 General stair comments: pt was able to ascend/descend 4 stair with supervision     Balance Overall balance assessment: Modified Independent Sitting-balance support: Feet supported;No upper extremity supported Sitting balance-Leahy Scale: Good     Standing balance support: Bilateral upper extremity supported Standing balance-Leahy Scale: Good         Cognition Arousal/Alertness: Awake/alert Behavior During Therapy: WFL for tasks assessed/performed Overall Cognitive Status: Within Functional Limits for tasks assessed          General Comments: Pt is A and O x 4      Exercises Total Joint Exercises Ankle Circles/Pumps: AROM;Strengthening;Right;10 reps;Supine Quad Sets: AROM;Strengthening;Right;Supine;10 reps Heel Slides: AROM;10 reps;Supine Hip ABduction/ADduction: AROM;10 reps;Supine Straight Leg Raises: AROM;5 reps Goniometric ROM: 1-90    General Comments General comments (skin integrity, edema, etc.): pt requested to use BR once returned to room. Will address ROM/strengthening ther ex in PM session.      Pertinent Vitals/Pain Pain Assessment: 0-10 Pain Score: 7  Pain Location: R knee Pain Descriptors / Indicators: Aching;Sore Pain Intervention(s): Limited activity within patient's tolerance;Monitored during session;Premedicated before session;Repositioned;Ice applied           PT Goals (current goals can now be found in the care plan section) Acute Rehab PT Goals Patient Stated Goal: to go home and eventually return to work Progress towards PT goals: Progressing toward goals    Frequency    BID  PT Plan Current plan remains appropriate       AM-PAC PT "6 Clicks" Mobility   Outcome Measure   Help needed turning from your back to your side while in a flat bed without using bedrails?: None Help needed moving from lying on your back to sitting on the side of a flat bed without using bedrails?: None Help needed moving to and from a bed to a chair (including a wheelchair)?: A Little Help needed standing up from a chair using your arms (e.g., wheelchair or bedside chair)?: A Little Help needed to walk in hospital room?: A Little Help needed climbing 3-5 steps with a railing? : A Little 6 Click Score: 20    End of Session Equipment Utilized During Treatment: Gait belt Activity Tolerance: Patient tolerated treatment well Patient left: Other (comment) (IN BR for BM. RN/RN tech aware) Nurse Communication: Mobility status PT Visit Diagnosis: Other abnormalities of gait and mobility (R26.89);Muscle weakness (generalized) (M62.81)     Time: 5053-9767 PT Time Calculation (min) (ACUTE ONLY): 24 min  Charges:  $Gait Training: 8-22 mins $Therapeutic Exercise: 8-22 mins                     Jetta Lout PTA 02/15/21, 3:09 PM

## 2021-02-15 NOTE — Progress Notes (Signed)
   Subjective: 1 Day Post-Op Procedure(s) (LRB): TOTAL KNEE ARTHROPLASTY - [place] Neighbor to Assist (Right) Patient reports pain as mild.   Patient is well, and has had no acute complaints or problems Denies any CP, SOB, ABD pain. We will continue therapy today.  Plan is to go Home after hospital stay.  Objective: Vital signs in last 24 hours: Temp:  [97.6 F (36.4 C)-98.2 F (36.8 C)] 98 F (36.7 C) (03/09 0747) Pulse Rate:  [70-87] 78 (03/09 0747) Resp:  [6-20] 16 (03/09 0747) BP: (103-133)/(72-101) 119/72 (03/09 0747) SpO2:  [93 %-99 %] 97 % (03/09 0747)  Intake/Output from previous day: 03/08 0701 - 03/09 0700 In: 2080.1 [P.O.:480; I.V.:1400; IV Piggyback:200.1] Out: 1050 [Urine:950; Blood:100] Intake/Output this shift: No intake/output data recorded.  Recent Labs    02/14/21 1213 02/15/21 0501  HGB 13.5 11.8*   Recent Labs    02/14/21 1213 02/15/21 0501  WBC 6.3 9.1  RBC 4.22 3.68*  HCT 37.6* 33.9*  PLT 187 174   Recent Labs    02/14/21 1213 02/15/21 0501  NA  --  133*  K  --  5.1  CL  --  102  CO2  --  25  BUN  --  11  CREATININE 0.75 0.90  GLUCOSE  --  149*  CALCIUM  --  8.1*   No results for input(s): LABPT, INR in the last 72 hours.  EXAM General - Patient is Alert, Appropriate and Oriented Extremity - Neurovascular intact Sensation intact distally Intact pulses distally Dorsiflexion/Plantar flexion intact No cellulitis present Compartment soft Dressing - dressing C/D/I and no drainage, prevena intact with out drainage Motor Function - intact, moving foot and toes well on exam.   Past Medical History:  Diagnosis Date  . Aneurysm of leg, right (HCC)   . Anxiety   . Arthritis   . Blood clot in vein    left leg; Tx Oral medication  . Diabetes mellitus without complication (HCC)   . Hyperlipemia   . Hypertension   . PONV (postoperative nausea and vomiting)     Assessment/Plan:   1 Day Post-Op Procedure(s) (LRB): TOTAL KNEE  ARTHROPLASTY - [place] Neighbor to Assist (Right) Active Problems:   S/P TKR (total knee replacement) using cement, right  Estimated body mass index is 31.45 kg/m as calculated from the following:   Height as of this encounter: 5\' 9"  (1.753 m).   Weight as of this encounter: 96.6 kg. Advance diet Up with therapy  Work on and vital signs stable Pain well controlled CM to assist with discharge to home with HHPT, likely tomorrow  DVT Prophylaxis - Lovenox, TED hose and SCDs Weight-Bearing as tolerated to right leg   [employer], PA-C [facility] 02/15/2021, 8:02 AM

## 2021-02-15 NOTE — Anesthesia Postprocedure Evaluation (Signed)
Anesthesia Post Note  Patient: Douglas Anthony  Procedure(s) Performed: TOTAL KNEE ARTHROPLASTY - [place] Neighbor to Assist (Right Knee)  Patient location during evaluation: Nursing Unit Anesthesia Type: Spinal Level of consciousness: oriented and awake and alert Pain management: pain level controlled Vital Signs Assessment: post-procedure vital signs reviewed and stable Respiratory status: spontaneous breathing, respiratory function stable and patient connected to nasal cannula oxygen Cardiovascular status: blood pressure returned to baseline and stable Postop Assessment: no headache, no backache and no apparent nausea or vomiting Anesthetic complications: no   No complications documented.   Last Vitals:  Vitals:   02/15/21 0100 02/15/21 0331  BP: 115/86 112/78  Pulse: 81 87  Resp: 16 20  Temp: 36.5 C 36.7 C  SpO2: 99% 97%    Last Pain:  Vitals:   02/14/21 2121  TempSrc:   PainSc: Asleep                 Avenir Lozinski,  Alessandra Bevels

## 2021-02-15 NOTE — Consult Note (Signed)
   [facility] CM Inpatient Consult   02/15/2021  KHOURY SIEMON Oct 31, 1961 [id number]   [facility] [[facility]]  Accountable Care Organization [ACO] Patient: [place] plan  Patient is currently assigned to a  [facility] RN Care Management Coordinator for post hospital follow up needs and for community resource support.    Plan: Will follow for progress and post hospital needs.   For additional questions or referrals please contact:   Charlesetta Shanks, RN BSN CCM [facility]  [phone] business mobile phone Toll free office [phone]  Fax number: [phone] [email or web address]

## 2021-02-15 NOTE — Progress Notes (Signed)
Physical Therapy Treatment Patient Details Name: Douglas Anthony MRN: [id number] DOB: Aug 04, 1961 Today's Date: 02/15/2021    History of Present Illness Primary localized osteoarthritis of right knee s/p right TKA 02/14/21. Past medical history includes popliteal artery bypass grafting Left, DVT, hypertension, aneurysm, anxiety, arthritis, sleep apnea, diabetes, hyperlipidemia    PT Comments    Pt was long sitting in bed with bone foam in place and polar care applied. He agrees to PT session and is cooperative and pleasant throughout. He was able to exit R side of bed without assistance. Sat EOB unsupported x several minutes prior to stand and ambulating 200 ft with CGA.Was able to safely perform ascending/descending 4 stair with L rail only. Pt requested to have BM once returned to room. He was sitting on elevated toilet height with RN and RN tech aware. Will return for PM session to address ROM and strengthening.     Follow Up Recommendations  Home health PT     Equipment Recommendations  Rolling walker with 5" wheels    Recommendations for Other Services       Precautions / Restrictions Precautions Precautions: Fall;Knee Precaution Booklet Issued: Yes (comment) Restrictions Weight Bearing Restrictions: Yes RLE Weight Bearing: Weight bearing as tolerated    Mobility  Bed Mobility Overal bed mobility: Modified Independent                  Transfers Overall transfer level: Needs assistance Equipment used: Rolling walker (2 wheeled) Transfers: Sit to/from Stand Sit to Stand: Min guard            Ambulation/Gait Ambulation/Gait assistance: Min guard Gait Distance (Feet): 200 Feet Assistive device: Rolling walker (2 wheeled) Gait Pattern/deviations: Step-to pattern;Step-through pattern Gait velocity: WNL   General Gait Details: pt was easily able to ambulate 200 ft with RW with step to pattern at first progressing to step through pattern by  end   Stairs Stairs: Yes Stairs assistance: Supervision Stair Management: One rail Left;Step to pattern;Forwards Number of Stairs: 4 General stair comments: pt was able to ascend/descend 4 stair with supervision   Wheelchair Mobility    Modified Rankin (Stroke Patients Only)       Balance Overall balance assessment: Needs assistance Sitting-balance support: Feet supported;No upper extremity supported Sitting balance-Leahy Scale: Good     Standing balance support: Bilateral upper extremity supported Standing balance-Leahy Scale: Good                              Cognition Arousal/Alertness: Awake/alert Behavior During Therapy: WFL for tasks assessed/performed Overall Cognitive Status: Within Functional Limits for tasks assessed                                 General Comments: Pt is A and O x 4      Exercises      General Comments General comments (skin integrity, edema, etc.): pt requested to use BR once returned to room. Will address ROM/strengthening ther ex in PM session.      Pertinent Vitals/Pain Pain Assessment: 0-10 Pain Score: 4  Pain Location: R knee Pain Descriptors / Indicators: Aching;Sore Pain Intervention(s): Limited activity within patient's tolerance;Monitored during session;Premedicated before session    Home Living                      Prior Function  PT Goals (current goals can now be found in the care plan section) Acute Rehab PT Goals Patient Stated Goal: to go home and eventually return to work Progress towards PT goals: Progressing toward goals    Frequency    BID      PT Plan Current plan remains appropriate    Co-evaluation              AM-PAC PT "6 Clicks" Mobility   Outcome Measure  Help needed turning from your back to your side while in a flat bed without using bedrails?: None Help needed moving from lying on your back to sitting on the side of a flat bed  without using bedrails?: None Help needed moving to and from a bed to a chair (including a wheelchair)?: A Little Help needed standing up from a chair using your arms (e.g., wheelchair or bedside chair)?: A Little Help needed to walk in hospital room?: A Little Help needed climbing 3-5 steps with a railing? : A Little 6 Click Score: 20    End of Session Equipment Utilized During Treatment: Gait belt Activity Tolerance: Patient tolerated treatment well Patient left: Other (comment) (In BR with RN tech/RN aware) Nurse Communication: Mobility status PT Visit Diagnosis: Other abnormalities of gait and mobility (R26.89);Muscle weakness (generalized) (M62.81)     Time: 4132-4401 PT Time Calculation (min) (ACUTE ONLY): 24 min  Charges:  $Gait Training: 23-37 mins                     Jetta Lout PTA 02/15/21, 12:17 PM

## 2021-02-15 NOTE — Plan of Care (Signed)
  Problem: Education: Goal: Knowledge of General Education information will improve Description: Including pain rating scale, medication(s)/side effects and non-pharmacologic comfort measures 02/15/2021 0839 by Jean Rosenthal, RN Outcome: Progressing 02/15/2021 0831 by Jean Rosenthal, RN Outcome: Progressing   Problem: Health Behavior/Discharge Planning: Goal: Ability to manage health-related needs will improve 02/15/2021 0839 by Jean Rosenthal, RN Outcome: Progressing 02/15/2021 0831 by Jean Rosenthal, RN Outcome: Progressing   Problem: Clinical Measurements: Goal: Ability to maintain clinical measurements within normal limits will improve 02/15/2021 0839 by Jean Rosenthal, RN Outcome: Progressing 02/15/2021 0831 by Jean Rosenthal, RN Outcome: Progressing Goal: Will remain free from infection 02/15/2021 0839 by Jean Rosenthal, RN Outcome: Progressing 02/15/2021 0831 by Jean Rosenthal, RN Outcome: Progressing Goal: Diagnostic test results will improve 02/15/2021 0839 by Jean Rosenthal, RN Outcome: Progressing 02/15/2021 0831 by Jean Rosenthal, RN Outcome: Progressing Goal: Respiratory complications will improve 02/15/2021 0839 by Jean Rosenthal, RN Outcome: Progressing 02/15/2021 0831 by Jean Rosenthal, RN Outcome: Progressing Goal: Cardiovascular complication will be avoided 02/15/2021 0839 by Jean Rosenthal, RN Outcome: Progressing 02/15/2021 0831 by Jean Rosenthal, RN Outcome: Progressing   Problem: Nutrition: Goal: Adequate nutrition will be maintained 02/15/2021 0839 by Jean Rosenthal, RN Outcome: Progressing 02/15/2021 0831 by Jean Rosenthal, RN Outcome: Progressing   Problem: Activity: Goal: Risk for activity intolerance will decrease 02/15/2021 0839 by Jean Rosenthal, RN Outcome: Progressing 02/15/2021 0831 by Jean Rosenthal, RN Outcome: Progressing   Problem: Coping: Goal: Level of anxiety will decrease 02/15/2021 0839 by Jean Rosenthal, RN Outcome: Progressing 02/15/2021 0831 by Jean Rosenthal, RN Outcome: Progressing   Problem: Elimination: Goal: Will not experience complications related to bowel motility 02/15/2021 0839 by Jean Rosenthal, RN Outcome: Progressing 02/15/2021 0831 by Jean Rosenthal, RN Outcome: Progressing Goal: Will not experience complications related to urinary retention 02/15/2021 0839 by Jean Rosenthal, RN Outcome: Progressing 02/15/2021 0831 by Jean Rosenthal, RN Outcome: Progressing   Problem: Pain Managment: Goal: General experience of comfort will improve 02/15/2021 0839 by Jean Rosenthal, RN Outcome: Progressing 02/15/2021 0831 by Jean Rosenthal, RN Outcome: Progressing   Problem: Safety: Goal: Ability to remain free from injury will improve 02/15/2021 0839 by Jean Rosenthal, RN Outcome: Progressing 02/15/2021 0831 by Jean Rosenthal, RN Outcome: Progressing   Problem: Skin Integrity: Goal: Risk for impaired skin integrity will decrease 02/15/2021 0839 by Jean Rosenthal, RN Outcome: Progressing 02/15/2021 0831 by Jean Rosenthal, RN Outcome: Progressing   Problem: Activity: Goal: Ability to avoid complications of mobility impairment will improve 02/15/2021 0839 by Jean Rosenthal, RN Outcome: Progressing 02/15/2021 0831 by Jean Rosenthal, RN Outcome: Progressing Goal: Range of joint motion will improve 02/15/2021 0839 by Jean Rosenthal, RN Outcome: Progressing 02/15/2021 0831 by Jean Rosenthal, RN Outcome: Progressing   Problem: Clinical Measurements: Goal: Postoperative complications will be avoided or minimized 02/15/2021 0839 by Jean Rosenthal, RN Outcome: Progressing 02/15/2021 0831 by Jean Rosenthal, RN Outcome: Progressing   Problem: Pain Management: Goal: Pain level will decrease with appropriate interventions 02/15/2021 0839 by Jean Rosenthal, RN Outcome: Progressing 02/15/2021 0831 by Jean Rosenthal, RN Outcome: Progressing    Problem: Skin Integrity: Goal: Will show signs of wound healing 02/15/2021 0839 by Jean Rosenthal, RN Outcome: Progressing 02/15/2021 0831 by Jean Rosenthal, RN Outcome: Progressing

## 2021-02-15 NOTE — Plan of Care (Signed)
  Problem: Education: Goal: Knowledge of General Education information will improve Description: Including pain rating scale, medication(s)/side effects and non-pharmacologic comfort measures Outcome: Progressing   Problem: Health Behavior/Discharge Planning: Goal: Ability to manage health-related needs will improve Outcome: Progressing   Problem: Clinical Measurements: Goal: Ability to maintain clinical measurements within normal limits will improve Outcome: Progressing Goal: Will remain free from infection Outcome: Progressing Goal: Diagnostic test results will improve Outcome: Progressing Goal: Respiratory complications will improve Outcome: Progressing Goal: Cardiovascular complication will be avoided Outcome: Progressing   Problem: Activity: Goal: Risk for activity intolerance will decrease Outcome: Progressing   Problem: Nutrition: Goal: Adequate nutrition will be maintained Outcome: Progressing   Problem: Coping: Goal: Level of anxiety will decrease Outcome: Progressing   Problem: Elimination: Goal: Will not experience complications related to bowel motility Outcome: Progressing Goal: Will not experience complications related to urinary retention Outcome: Progressing   Problem: Pain Managment: Goal: General experience of comfort will improve Outcome: Progressing   Problem: Safety: Goal: Ability to remain free from injury will improve Outcome: Progressing   Problem: Skin Integrity: Goal: Risk for impaired skin integrity will decrease Outcome: Progressing   Problem: Education: Goal: Knowledge of the prescribed therapeutic regimen will improve Outcome: Progressing Goal: Individualized Educational Video(s) Outcome: Progressing   Problem: Activity: Goal: Ability to avoid complications of mobility impairment will improve Outcome: Progressing Goal: Range of joint motion will improve Outcome: Progressing   Problem: Clinical Measurements: Goal:  Postoperative complications will be avoided or minimized Outcome: Progressing   Problem: Pain Management: Goal: Pain level will decrease with appropriate interventions Outcome: Progressing   

## 2021-02-16 ENCOUNTER — Other Ambulatory Visit: Payer: Self-pay | Admitting: Orthopedic Surgery

## 2021-02-16 LAB — GLUCOSE, CAPILLARY
Glucose-Capillary: 166 mg/dL — ABNORMAL HIGH (ref 70–99)
Glucose-Capillary: 133 mg/dL — ABNORMAL HIGH (ref 70–99)

## 2021-02-16 MED ORDER — OXYCODONE HCL 5 MG PO TABS: 5.0000 mg | ORAL_TABLET | ORAL | 0 refills | Status: DC | PRN

## 2021-02-16 MED ORDER — DOCUSATE SODIUM 100 MG PO CAPS: 100.0000 mg | ORAL_CAPSULE | Freq: Two times a day (BID) | ORAL | 0 refills | Status: DC

## 2021-02-16 MED ORDER — METHOCARBAMOL 500 MG PO TABS: 500.0000 mg | ORAL_TABLET | Freq: Four times a day (QID) | ORAL | 0 refills | Status: DC | PRN

## 2021-02-16 MED ORDER — ENOXAPARIN SODIUM 40 MG/0.4ML ~~LOC~~ SOLN: 40.0000 mg | SUBCUTANEOUS | 0 refills | Status: DC

## 2021-02-16 MED ORDER — TRAMADOL HCL 50 MG PO TABS: 50.0000 mg | ORAL_TABLET | Freq: Four times a day (QID) | ORAL | 0 refills | Status: DC | PRN

## 2021-02-16 NOTE — Discharge Summary (Signed)
Physician Discharge Summary  Patient ID: Douglas Anthony MRN: [id number] DOB/AGE: 61/04/1961 61 y.o.  Admit date: 02/14/2021 Discharge date: 02/16/2021  Admission Diagnoses:  S/P TKR (total knee replacement) using cement, right [Z96.651]   Discharge Diagnoses: Patient Active Problem List   Diagnosis Date Noted  . S/P TKR (total knee replacement) using cement, right 02/14/2021  . 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 Knee pain, left 10/05/2015    Past Medical History:  Diagnosis Date  . Aneurysm of leg, right (HCC)   . Anxiety   . Arthritis   . Blood clot in vein    left leg; Tx Oral medication  . Diabetes mellitus without complication (HCC)   . Hyperlipemia   . Hypertension   . PONV (postoperative nausea and vomiting)      Transfusion: None   Consultants (if any):   Discharged Condition: Improved  Hospital Course: Douglas Anthony is an 60 y.o. male who was admitted 02/14/2021 with a diagnosis of right knee osteoarthritis and went to the operating room on 02/14/2021 and underwent the above named procedures.    Surgeries: Procedure(s): TOTAL KNEE ARTHROPLASTY - [place] Neighbor to Assist on 02/14/2021 Patient tolerated the surgery well. Taken to PACU where she was stabilized and then transferred to the orthopedic floor.  Started on Lovenox 30 mg q 12 hrs. Foot pumps applied bilaterally at 80 mm. Heels elevated on bed with rolled towels. No evidence of DVT. Negative Homan. Physical therapy started on day #1 for gait training and transfer. OT started day #1 for ADL and assisted devices.  Patient's foley was d/c on day #1. Patient's IV was d/c on day #2.  On post op day #2 patient was stable and ready for discharge to home with HHPT.  Implants: Medacta GMK sphere system with right 62femur, right 4tibia with short stem and 57mm insert. Size 3patella, all components cemented  He was given perioperative antibiotics:   Anti-infectives (From admission, onward)   Start     Dose/Rate Route Frequency Ordered Stop   02/14/21 1330  ceFAZolin (ANCEF) IVPB 2g/100 mL premix        2 g 200 mL/hr over 30 Minutes Intravenous Every 6 hours 02/14/21 0946 02/14/21 2109   02/14/21 0610  ceFAZolin (ANCEF) 2-4 GM/100ML-% IVPB       Note to Pharmacy: Kerman Passey, Cryst: cabinet override      02/14/21 0610 02/14/21 0738   02/14/21 0600  ceFAZolin (ANCEF) IVPB 2g/100 mL premix        2 g 200 mL/hr over 30 Minutes Intravenous On call to O.R. 02/14/21 0144 02/14/21 0736    .  He was given sequential compression devices, early ambulation, and Lovenox, teds for DVT prophylaxis.  He benefited maximally from the hospital stay and there were no complications.    Recent vital signs:  Vitals:   02/16/21 0435 02/16/21 0732  BP: 112/82 128/79  Pulse: (!) 102 (!) 108  Resp: 17 17  Temp: 98 F (36.7 C) 98.7 F (37.1 C)  SpO2: 94% 98%    Recent laboratory studies:  Lab Results  Component Value Date   HGB 11.8 (L) 02/15/2021   HGB 13.5 02/14/2021   HGB 15.1 01/03/2021   Lab Results  Component Value Date   WBC 9.1 02/15/2021   PLT 174 02/15/2021   Lab Results  Component Value Date   INR 1.04 11/11/2015   Lab Results  Component Value Date   NA 133 (  L) 02/15/2021   K 5.1 02/15/2021   CL 102 02/15/2021   CO2 25 02/15/2021   BUN 11 02/15/2021   CREATININE 0.90 02/15/2021   GLUCOSE 149 (H) 02/15/2021    Discharge Medications:   Allergies as of 02/16/2021   No Known Allergies     Medication List    STOP taking these medications   aspirin 81 MG tablet   etodolac 500 MG tablet Commonly known as: LODINE     TAKE these medications   acetaminophen 500 MG tablet Commonly known as: TYLENOL Take 500 mg by mouth every 8 (eight) hours as needed for moderate pain.   amLODipine 10 MG tablet Commonly known as: NORVASC Take 10 mg by mouth daily.   atorvastatin 20 MG tablet Commonly known as:  LIPITOR Take 20 mg by mouth daily.   docusate sodium 100 MG capsule Commonly known as: COLACE Take 1 capsule (100 mg total) by mouth 2 (two) times daily.   enoxaparin 40 MG/0.4ML injection Commonly known as: Lovenox Inject 0.4 mLs (40 mg total) into the skin daily for 14 days.   levocetirizine 5 MG tablet Commonly known as: XYZAL Take 5 mg by mouth daily.   LORazepam 1 MG tablet Commonly known as: ATIVAN Take 1 mg by mouth every 8 (eight) hours as needed for anxiety. PRN   magnesium oxide 400 MG tablet Commonly known as: MAG-OX Take 400 mg by mouth daily.   metFORMIN 500 MG tablet Commonly known as: GLUCOPHAGE Take 1 tablet (500 mg total) by mouth 2 (two) times daily with a meal. What changed:   how much to take  additional instructions   methocarbamol 500 MG tablet Commonly known as: ROBAXIN Take 1 tablet (500 mg total) by mouth every 6 (six) hours as needed for muscle spasms.   oxyCODONE 5 MG immediate release tablet Commonly known as: Oxy IR/ROXICODONE Take 1-2 tablets (5-10 mg total) by mouth every 4 (four) hours as needed for moderate pain (pain score 4-6).   quinapril 40 MG tablet Commonly known as: ACCUPRIL Take 40 mg by mouth daily.   traMADol 50 MG tablet Commonly known as: ULTRAM Take 1 tablet (50 mg total) by mouth every 6 (six) hours as needed.   True Metrix Blood Glucose Test test strip Generic drug: glucose blood   TRUEplus Lancets 30G Misc   Turmeric 500 MG Caps Take 500 mg by mouth in the morning and at bedtime.            Durable Medical Equipment  (From admission, onward)         Start     Ordered   02/16/21 1020  For home use only DME 3 n 1  Once        02/16/21 1019   02/14/21 1051  DME Walker rolling  Once       Question Answer Comment  Walker: With 5 Inch Wheels   Patient needs a walker to treat with the following condition S/P TKR (total knee replacement) using cement, right      02/14/21 1050   02/14/21 1051  DME 3 n 1   Once        02/14/21 1050   02/14/21 1051  DME Bedside commode  Once       Question:  Patient needs a bedside commode to treat with the following condition  Answer:  S/P TKR (total knee replacement) using cement, right   02/14/21 1050          Diagnostic  Studies: DG Knee 1-2 Views Right  Result Date: 02/14/2021 CLINICAL DATA:  Status post total knee replacement on the right EXAM: RIGHT KNEE - 1-2 VIEW COMPARISON:  October 13, 2014 radiographic examination; right knee CT November 23, 2020 FINDINGS: Frontal and lateral views obtained. Patient is status post total knee replacement with femoral, tibial, and patellar prosthetic components well-seated. No fracture or dislocation. No erosion. Areas of soft tissue air and overlying skin staples noted. IMPRESSION: Total knee replacement with prosthetic components well-seated. No fracture or dislocation. Acute postoperative changes noted. Electronically Signed   By: Bretta Bang III M.D.   On: 02/14/2021 10:04    Disposition: Discharge disposition: 06-Home-Health Care Svc          Follow-up Information    Evon Slack, PA-C Follow up in 2 week(s).   Specialties: Orthopedic Surgery, Emergency Medicine Contact information: [address] [place] [address] [phone]                Signed: Patience Musca 02/16/2021, 11:14 AM

## 2021-02-16 NOTE — Plan of Care (Signed)
  Problem: Education: Goal: Knowledge of General Education information will improve Description: Including pain rating scale, medication(s)/side effects and non-pharmacologic comfort measures Outcome: Progressing   Problem: Health Behavior/Discharge Planning: Goal: Ability to manage health-related needs will improve Outcome: Progressing   Problem: Clinical Measurements: Goal: Ability to maintain clinical measurements within normal limits will improve Outcome: Progressing Goal: Will remain free from infection Outcome: Progressing Goal: Diagnostic test results will improve Outcome: Progressing Goal: Respiratory complications will improve Outcome: Progressing Goal: Cardiovascular complication will be avoided Outcome: Progressing   Problem: Nutrition: Goal: Adequate nutrition will be maintained Outcome: Progressing   Problem: Coping: Goal: Level of anxiety will decrease Outcome: Progressing   Problem: Pain Managment: Goal: General experience of comfort will improve Outcome: Progressing   Problem: Safety: Goal: Ability to remain free from injury will improve Outcome: Progressing   Problem: Skin Integrity: Goal: Risk for impaired skin integrity will decrease Outcome: Progressing   Problem: Education: Goal: Knowledge of the prescribed therapeutic regimen will improve Outcome: Progressing Goal: Individualized Educational Video(s) Outcome: Progressing   Problem: Activity: Goal: Ability to avoid complications of mobility impairment will improve Outcome: Progressing Goal: Range of joint motion will improve Outcome: Progressing

## 2021-02-16 NOTE — Progress Notes (Signed)
   Subjective: 2 Days Post-Op Procedure(s) (LRB): TOTAL KNEE ARTHROPLASTY - [place] Neighbor to Assist (Right) Patient reports pain as mild.   Patient is well, and has had no acute complaints or problems Denies any CP, SOB, ABD pain. We will continue therapy today.  Plan is to go Home after hospital stay.  Objective: Vital signs in last 24 hours: Temp:  [97.9 F (36.6 C)-98.7 F (37.1 C)] 98.7 F (37.1 C) (03/10 0732) Pulse Rate:  [98-108] 108 (03/10 0732) Resp:  [15-17] 17 (03/10 0732) BP: (112-136)/(79-89) 128/79 (03/10 0732) SpO2:  [93 %-98 %] 98 % (03/10 0732)  Intake/Output from previous day: 03/09 0701 - 03/10 0700 In: 2246.7 [I.V.:2246.7] Out: 751 [Urine:750; Stool:1] Intake/Output this shift: Total I/O In: 360 [P.O.:360] Out: -   Recent Labs    02/14/21 1213 02/15/21 0501  HGB 13.5 11.8*   Recent Labs    02/14/21 1213 02/15/21 0501  WBC 6.3 9.1  RBC 4.22 3.68*  HCT 37.6* 33.9*  PLT 187 174   Recent Labs    02/14/21 1213 02/15/21 0501  NA  --  133*  K  --  5.1  CL  --  102  CO2  --  25  BUN  --  11  CREATININE 0.75 0.90  GLUCOSE  --  149*  CALCIUM  --  8.1*   No results for input(s): LABPT, INR in the last 72 hours.  EXAM General - Patient is Alert, Appropriate and Oriented Extremity - Neurovascular intact Sensation intact distally Intact pulses distally Dorsiflexion/Plantar flexion intact No cellulitis present Compartment soft Dressing - dressing C/D/I and no drainage, prevena intact with out drainage Motor Function - intact, moving foot and toes well on exam.   Past Medical History:  Diagnosis Date  . Aneurysm of leg, right (HCC)   . Anxiety   . Arthritis   . Blood clot in vein    left leg; Tx Oral medication  . Diabetes mellitus without complication (HCC)   . Hyperlipemia   . Hypertension   . PONV (postoperative nausea and vomiting)     Assessment/Plan:   2 Days Post-Op Procedure(s) (LRB): TOTAL KNEE ARTHROPLASTY - [place] Neighbor to Assist (Right) Active Problems:   S/P TKR (total knee replacement) using cement, right  Estimated body mass index is 31.45 kg/m as calculated from the following:   Height as of this encounter: 5\' 9"  (1.753 m).   Weight as of this encounter: 96.6 kg. Advance diet Up with therapy  Labs and vital signs stable Pain well controlled CM to assist with discharge to home with HHPT today  DVT Prophylaxis - Lovenox, TED hose and SCDs Weight-Bearing as tolerated to right leg   T. , PA-C [facility] 02/16/2021, 11:09 AM

## 2021-02-16 NOTE — Plan of Care (Signed)
  Problem: Education: Goal: Knowledge of General Education information will improve Description: Including pain rating scale, medication(s)/side effects and non-pharmacologic comfort measures 02/16/2021 1206 by Jean Rosenthal, RN Outcome: Adequate for Discharge 02/16/2021 0823 by Jean Rosenthal, RN Outcome: Progressing

## 2021-02-16 NOTE — Progress Notes (Signed)
Physical Therapy Treatment Patient Details Name: Douglas Anthony MRN: [id number] DOB: 02-11-1961 Today's Date: 02/16/2021    History of Present Illness Primary localized osteoarthritis of right knee s/p right TKA 02/14/21. Past medical history includes popliteal artery bypass grafting Left, DVT, hypertension, aneurysm, anxiety, arthritis, sleep apnea, diabetes, hyperlipidemia    PT Comments    Pt was sitting in recliner upon arriving. He agrees to PT session and is cooperative/motivated. He was able to stand to RW with supervision. Tolerated ambulation 250 ft with supervision and step through gait sequencing. NO LOB or unsteadiness. Returned to room and perform ROM/strengthening. Was able to achieve 91 degrees flexion in sitting position. PT recommend HHPT at DC. Cleared form PT standpoint for safe DC home. Will continue to follow until DC 'd.    Follow Up Recommendations  Home health PT     Equipment Recommendations  3in1 (PT);Other (comment)    Recommendations for Other Services       Precautions / Restrictions Precautions Precautions: Fall;Knee Precaution Booklet Issued: Yes (comment) Restrictions Weight Bearing Restrictions: Yes RLE Weight Bearing: Weight bearing as tolerated    Mobility  Bed Mobility Overal bed mobility: Modified Independent     Transfers Overall transfer level: Needs assistance Equipment used: Rolling walker (2 wheeled) Transfers: Sit to/from Stand Sit to Stand: Supervision     Ambulation/Gait Ambulation/Gait assistance: Supervision Gait Distance (Feet): 250 Feet Assistive device: Rolling walker (2 wheeled) Gait Pattern/deviations: Step-through pattern Gait velocity: WNL   General Gait Details: no LOB or difficulty ambulating 250 ft. did cue pt for improved heel strike and stay inside RW   Stairs Stairs:  (pt demonstrated proper performance over past few session. He did not want to perform again this date.)       Balance Overall balance  assessment: Modified Independent Sitting-balance support: Feet supported;No upper extremity supported Sitting balance-Leahy Scale: Good     Standing balance support: Bilateral upper extremity supported Standing balance-Leahy Scale: Good           Cognition Arousal/Alertness: Awake/alert Behavior During Therapy: WFL for tasks assessed/performed Overall Cognitive Status: Within Functional Limits for tasks assessed        General Comments: Pt is A and O x 4      Exercises Total Joint Exercises Goniometric ROM: 2-91     Pertinent Vitals/Pain Pain Assessment: 0-10 Pain Score: 7  Pain Location: R knee Pain Descriptors / Indicators: Aching;Sore Pain Intervention(s): Limited activity within patient's tolerance;Monitored during session;Premedicated before session;Repositioned;Ice applied           PT Goals (current goals can now be found in the care plan section) Acute Rehab PT Goals Patient Stated Goal: to go home and eventually return to work Progress towards PT goals: Progressing toward goals    Frequency    BID      PT Plan Current plan remains appropriate       AM-PAC PT "6 Clicks" Mobility   Outcome Measure  Help needed turning from your back to your side while in a flat bed without using bedrails?: None Help needed moving from lying on your back to sitting on the side of a flat bed without using bedrails?: None Help needed moving to and from a bed to a chair (including a wheelchair)?: None Help needed standing up from a chair using your arms (e.g., wheelchair or bedside chair)?: A Little Help needed to walk in hospital room?: A Little Help needed climbing 3-5 steps with a railing? : A Little 6 Click  Score: 21    End of Session Equipment Utilized During Treatment: Gait belt Activity Tolerance: Patient tolerated treatment well Patient left: in chair;with call bell/phone within reach Nurse Communication: Mobility status PT Visit Diagnosis: Other  abnormalities of gait and mobility (R26.89);Muscle weakness (generalized) (M62.81)     Time: 5643-3295 PT Time Calculation (min) (ACUTE ONLY): 21 min  Charges:  $Gait Training: 8-22 mins                     Jetta Lout PTA 02/16/21, 10:24 AM

## 2021-02-16 NOTE — Discharge Instructions (Signed)

## 2021-02-17 ENCOUNTER — Other Ambulatory Visit: Payer: Self-pay | Admitting: *Deleted

## 2021-02-17 DIAGNOSIS — I10 Essential (primary) hypertension: Secondary | ICD-10-CM | POA: Diagnosis not present

## 2021-02-17 DIAGNOSIS — Z4801 Encounter for change or removal of surgical wound dressing: Secondary | ICD-10-CM | POA: Diagnosis not present

## 2021-02-17 DIAGNOSIS — E785 Hyperlipidemia, unspecified: Secondary | ICD-10-CM | POA: Diagnosis not present

## 2021-02-17 DIAGNOSIS — Z471 Aftercare following joint replacement surgery: Secondary | ICD-10-CM | POA: Diagnosis not present

## 2021-02-17 DIAGNOSIS — F419 Anxiety disorder, unspecified: Secondary | ICD-10-CM | POA: Diagnosis not present

## 2021-02-17 DIAGNOSIS — I724 Aneurysm of artery of lower extremity: Secondary | ICD-10-CM | POA: Diagnosis not present

## 2021-02-17 DIAGNOSIS — Z96651 Presence of right artificial knee joint: Secondary | ICD-10-CM | POA: Diagnosis not present

## 2021-02-17 DIAGNOSIS — E119 Type 2 diabetes mellitus without complications: Secondary | ICD-10-CM | POA: Diagnosis not present

## 2021-02-17 DIAGNOSIS — K219 Gastro-esophageal reflux disease without esophagitis: Secondary | ICD-10-CM | POA: Diagnosis not present

## 2021-02-17 NOTE — Patient Outreach (Signed)
[facility]) Care Management  02/17/2021  Douglas Anthony Aug 23, 1961 [id number]   Transition of care telephone call  Referral received:02/14/21 Initial outreach:02/17/21 Insurance: [facility]   Initial unsuccessful telephone call to patient's preferred number in order to complete transition of care assessment; no answer, left HIPAA compliant voicemail message requesting return call.   Objective: Per electronic record ,Douglas Anthony was hospitalized at [facility] for Right Total Knee Arthroplasty 3/8-3/10/22 Comorbidities include: Hypertension, Hyperlipidemia, Type 2 Diabetes,  He  was discharged to home on  02/16/21 ,per  inpatient TOC CM note on 3/9 Home health physical therapy previously arranged by [facility], noted patient also has rolling walker shower chair and toilet seat at home currently..     Plan: This RNCM will route unsuccessful outreach letter with [facility] Care Management pamphlet and 24 hour Nurse Advice Line Magnet to [employer] Care Management clinical pool to be mailed to patient's home address. This RNCM will attempt another outreach within 4 business days.   Egbert Garibaldi, RN, BSN  [facility] Care Management,Care Management Coordinator  [phone]- Mobile [phone]- Toll Free Main Office

## 2021-02-21 DIAGNOSIS — Z471 Aftercare following joint replacement surgery: Secondary | ICD-10-CM | POA: Diagnosis not present

## 2021-02-21 DIAGNOSIS — Z96651 Presence of right artificial knee joint: Secondary | ICD-10-CM | POA: Diagnosis not present

## 2021-02-21 DIAGNOSIS — I10 Essential (primary) hypertension: Secondary | ICD-10-CM | POA: Diagnosis not present

## 2021-02-21 DIAGNOSIS — E785 Hyperlipidemia, unspecified: Secondary | ICD-10-CM | POA: Diagnosis not present

## 2021-02-21 DIAGNOSIS — Z4801 Encounter for change or removal of surgical wound dressing: Secondary | ICD-10-CM | POA: Diagnosis not present

## 2021-02-21 DIAGNOSIS — K219 Gastro-esophageal reflux disease without esophagitis: Secondary | ICD-10-CM | POA: Diagnosis not present

## 2021-02-21 DIAGNOSIS — F419 Anxiety disorder, unspecified: Secondary | ICD-10-CM | POA: Diagnosis not present

## 2021-02-21 DIAGNOSIS — E119 Type 2 diabetes mellitus without complications: Secondary | ICD-10-CM | POA: Diagnosis not present

## 2021-02-21 DIAGNOSIS — I724 Aneurysm of artery of lower extremity: Secondary | ICD-10-CM | POA: Diagnosis not present

## 2021-02-22 ENCOUNTER — Encounter: Payer: Self-pay | Admitting: *Deleted

## 2021-02-22 ENCOUNTER — Other Ambulatory Visit: Payer: Self-pay | Admitting: *Deleted

## 2021-02-22 DIAGNOSIS — E119 Type 2 diabetes mellitus without complications: Secondary | ICD-10-CM | POA: Insufficient documentation

## 2021-02-22 NOTE — Patient Outreach (Signed)
[facility]) Care Management  02/22/2021  RHODERICK FARREL [birth year] [id number]  Transition of care call/case closure   Referral received:02/14/21 Initial outreach:02/17/21 Insurance: [place] [employer]    Subjective: 2nd attempt  successful telephone call to patient's preferred number in order to complete transition of care assessment; 2 HIPAA identifiers verified. Explained purpose of call and completed transition of care assessment.  Mr. Orlick states that he is getting better each day. He denies post-operative problems, says that he still has Provena wound vac in place without problems noted. He states system to be removed on 3/17 and another dressing placed. He states that home health physical therapy will assist him with this. He states surgical pain well managed with prescribed medications, use of polar ice. He reports  tolerating diet, denies bowel or bladder problems.  Spouse is assisting with his recovery, she is able to assist with Lovenox injections.  He reports tolerating home health physical therapy sessions unable to recall name of agency,that visited once last week and plans 3 visits on this week. He discussed using rolling walker at home and working exercises and days therapy not visiting. Reinforced deep breathing exercises, states he left  incentive spirometry at  discharge.     Reviewed accessing the following [place] Benefits : He discussed  ongoing health issues of hypertension and diabetes is well controlled.States that he gets a good reports on diabetes control at MD visit unable to recall A1c results. He reports making changes in his diet limits  sweets He says he  does not need a referral to one of the [place] chronic disease management programs.  He does  have the hospital indemnity and has made contact to file a claim.  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   .    Objective:  Mr.Lauren Klingbeil was hospitalized [facility] for Right Total Knee Arthroplasty 3/8-3/10/22 Comorbidities include: Hypertension, Hyperlipidemia, Type 2 Diabetes,  He  was discharged to home on  02/16/21 ,per  inpatient TOC CM note on 3/9 Home health physical therapy previously arranged by [facility], noted patient also has rolling walker shower chair and toilet seat at home currently  Assessment:  Patient voices good understanding of all discharge instructions.  See transition of care flowsheet for assessment details.   Plan:  Reviewed hospital discharge diagnosis of Right Total knee arthroplasty    and discharge treatment plan using hospital discharge instructions, assessing medication adherence, reviewing problems requiring provider notification, and discussing the importance of follow up with surgeon, primary care provider and/or specialists as directed.  Reviewed [place] healthy lifestyle program information to receive discounted premium for  2023   Step 1: Get  your annual physical  Step 2: Complete your health assessment  Step 3:Identify your current health status and complete the corresponding action step between December 10, 2020 and August 10, 2021.     No ongoing care management needs identified so will close case to [facility] Care Management services. Patient was routed [facility] outreach letter at initial call attempt.   Thanked patient for their services to [facility].  Egbert Garibaldi, RN, BSN  [facility] Care Management,Care Management Coordinator  [phone]- Mobile [phone]- Toll Free Main Office

## 2021-02-23 DIAGNOSIS — E785 Hyperlipidemia, unspecified: Secondary | ICD-10-CM | POA: Diagnosis not present

## 2021-02-23 DIAGNOSIS — Z4801 Encounter for change or removal of surgical wound dressing: Secondary | ICD-10-CM | POA: Diagnosis not present

## 2021-02-23 DIAGNOSIS — K219 Gastro-esophageal reflux disease without esophagitis: Secondary | ICD-10-CM | POA: Diagnosis not present

## 2021-02-23 DIAGNOSIS — Z96651 Presence of right artificial knee joint: Secondary | ICD-10-CM | POA: Diagnosis not present

## 2021-02-23 DIAGNOSIS — E119 Type 2 diabetes mellitus without complications: Secondary | ICD-10-CM | POA: Diagnosis not present

## 2021-02-23 DIAGNOSIS — Z471 Aftercare following joint replacement surgery: Secondary | ICD-10-CM | POA: Diagnosis not present

## 2021-02-23 DIAGNOSIS — I724 Aneurysm of artery of lower extremity: Secondary | ICD-10-CM | POA: Diagnosis not present

## 2021-02-23 DIAGNOSIS — F419 Anxiety disorder, unspecified: Secondary | ICD-10-CM | POA: Diagnosis not present

## 2021-02-23 DIAGNOSIS — I10 Essential (primary) hypertension: Secondary | ICD-10-CM | POA: Diagnosis not present

## 2021-02-24 DIAGNOSIS — F419 Anxiety disorder, unspecified: Secondary | ICD-10-CM | POA: Diagnosis not present

## 2021-02-24 DIAGNOSIS — Z4801 Encounter for change or removal of surgical wound dressing: Secondary | ICD-10-CM | POA: Diagnosis not present

## 2021-02-24 DIAGNOSIS — Z96651 Presence of right artificial knee joint: Secondary | ICD-10-CM | POA: Diagnosis not present

## 2021-02-24 DIAGNOSIS — E119 Type 2 diabetes mellitus without complications: Secondary | ICD-10-CM | POA: Diagnosis not present

## 2021-02-24 DIAGNOSIS — E785 Hyperlipidemia, unspecified: Secondary | ICD-10-CM | POA: Diagnosis not present

## 2021-02-24 DIAGNOSIS — I10 Essential (primary) hypertension: Secondary | ICD-10-CM | POA: Diagnosis not present

## 2021-02-24 DIAGNOSIS — I724 Aneurysm of artery of lower extremity: Secondary | ICD-10-CM | POA: Diagnosis not present

## 2021-02-24 DIAGNOSIS — K219 Gastro-esophageal reflux disease without esophagitis: Secondary | ICD-10-CM | POA: Diagnosis not present

## 2021-02-24 DIAGNOSIS — Z471 Aftercare following joint replacement surgery: Secondary | ICD-10-CM | POA: Diagnosis not present

## 2021-02-27 DIAGNOSIS — Z471 Aftercare following joint replacement surgery: Secondary | ICD-10-CM | POA: Diagnosis not present

## 2021-02-27 DIAGNOSIS — Z4801 Encounter for change or removal of surgical wound dressing: Secondary | ICD-10-CM | POA: Diagnosis not present

## 2021-02-27 DIAGNOSIS — Z96651 Presence of right artificial knee joint: Secondary | ICD-10-CM | POA: Diagnosis not present

## 2021-02-27 DIAGNOSIS — I724 Aneurysm of artery of lower extremity: Secondary | ICD-10-CM | POA: Diagnosis not present

## 2021-02-27 DIAGNOSIS — I10 Essential (primary) hypertension: Secondary | ICD-10-CM | POA: Diagnosis not present

## 2021-02-27 DIAGNOSIS — K219 Gastro-esophageal reflux disease without esophagitis: Secondary | ICD-10-CM | POA: Diagnosis not present

## 2021-02-27 DIAGNOSIS — F419 Anxiety disorder, unspecified: Secondary | ICD-10-CM | POA: Diagnosis not present

## 2021-02-27 DIAGNOSIS — E119 Type 2 diabetes mellitus without complications: Secondary | ICD-10-CM | POA: Diagnosis not present

## 2021-02-27 DIAGNOSIS — E785 Hyperlipidemia, unspecified: Secondary | ICD-10-CM | POA: Diagnosis not present

## 2021-02-28 ENCOUNTER — Other Ambulatory Visit: Payer: Self-pay | Admitting: Orthopedic Surgery

## 2021-02-28 DIAGNOSIS — M25661 Stiffness of right knee, not elsewhere classified: Secondary | ICD-10-CM | POA: Diagnosis not present

## 2021-02-28 DIAGNOSIS — M6281 Muscle weakness (generalized): Secondary | ICD-10-CM | POA: Diagnosis not present

## 2021-02-28 DIAGNOSIS — M25561 Pain in right knee: Secondary | ICD-10-CM | POA: Diagnosis not present

## 2021-02-28 DIAGNOSIS — Z96651 Presence of right artificial knee joint: Secondary | ICD-10-CM | POA: Diagnosis not present

## 2021-03-02 DIAGNOSIS — M25661 Stiffness of right knee, not elsewhere classified: Secondary | ICD-10-CM | POA: Diagnosis not present

## 2021-03-02 DIAGNOSIS — M6281 Muscle weakness (generalized): Secondary | ICD-10-CM | POA: Diagnosis not present

## 2021-03-02 DIAGNOSIS — M25561 Pain in right knee: Secondary | ICD-10-CM | POA: Diagnosis not present

## 2021-03-02 DIAGNOSIS — Z96651 Presence of right artificial knee joint: Secondary | ICD-10-CM | POA: Diagnosis not present

## 2021-03-07 DIAGNOSIS — M6281 Muscle weakness (generalized): Secondary | ICD-10-CM | POA: Diagnosis not present

## 2021-03-07 DIAGNOSIS — Z96651 Presence of right artificial knee joint: Secondary | ICD-10-CM | POA: Diagnosis not present

## 2021-03-07 DIAGNOSIS — M25661 Stiffness of right knee, not elsewhere classified: Secondary | ICD-10-CM | POA: Diagnosis not present

## 2021-03-07 DIAGNOSIS — M25561 Pain in right knee: Secondary | ICD-10-CM | POA: Diagnosis not present

## 2021-03-10 DIAGNOSIS — M25661 Stiffness of right knee, not elsewhere classified: Secondary | ICD-10-CM | POA: Diagnosis not present

## 2021-03-10 DIAGNOSIS — M25561 Pain in right knee: Secondary | ICD-10-CM | POA: Diagnosis not present

## 2021-03-10 DIAGNOSIS — Z96651 Presence of right artificial knee joint: Secondary | ICD-10-CM | POA: Diagnosis not present

## 2021-03-10 DIAGNOSIS — M6281 Muscle weakness (generalized): Secondary | ICD-10-CM | POA: Diagnosis not present

## 2021-03-14 DIAGNOSIS — M25561 Pain in right knee: Secondary | ICD-10-CM | POA: Diagnosis not present

## 2021-03-14 DIAGNOSIS — M6281 Muscle weakness (generalized): Secondary | ICD-10-CM | POA: Diagnosis not present

## 2021-03-14 DIAGNOSIS — Z96651 Presence of right artificial knee joint: Secondary | ICD-10-CM | POA: Diagnosis not present

## 2021-03-14 DIAGNOSIS — M25661 Stiffness of right knee, not elsewhere classified: Secondary | ICD-10-CM | POA: Diagnosis not present

## 2021-03-15 ENCOUNTER — Other Ambulatory Visit: Payer: Self-pay

## 2021-03-15 MED FILL — Metformin HCl Tab 500 MG: ORAL | 90 days supply | Qty: 360 | Fill #0 | Status: AC

## 2021-03-15 MED FILL — Lorazepam Tab 1 MG: ORAL | 30 days supply | Qty: 30 | Fill #0 | Status: AC

## 2021-03-16 DIAGNOSIS — M25661 Stiffness of right knee, not elsewhere classified: Secondary | ICD-10-CM | POA: Diagnosis not present

## 2021-03-16 DIAGNOSIS — Z96651 Presence of right artificial knee joint: Secondary | ICD-10-CM | POA: Diagnosis not present

## 2021-03-16 DIAGNOSIS — M6281 Muscle weakness (generalized): Secondary | ICD-10-CM | POA: Diagnosis not present

## 2021-03-16 DIAGNOSIS — M25561 Pain in right knee: Secondary | ICD-10-CM | POA: Diagnosis not present

## 2021-03-21 DIAGNOSIS — Z96651 Presence of right artificial knee joint: Secondary | ICD-10-CM | POA: Diagnosis not present

## 2021-03-21 DIAGNOSIS — M6281 Muscle weakness (generalized): Secondary | ICD-10-CM | POA: Diagnosis not present

## 2021-03-21 DIAGNOSIS — M25561 Pain in right knee: Secondary | ICD-10-CM | POA: Diagnosis not present

## 2021-03-21 DIAGNOSIS — M25661 Stiffness of right knee, not elsewhere classified: Secondary | ICD-10-CM | POA: Diagnosis not present

## 2021-03-23 DIAGNOSIS — M25661 Stiffness of right knee, not elsewhere classified: Secondary | ICD-10-CM | POA: Diagnosis not present

## 2021-03-23 DIAGNOSIS — M6281 Muscle weakness (generalized): Secondary | ICD-10-CM | POA: Diagnosis not present

## 2021-03-23 DIAGNOSIS — M25561 Pain in right knee: Secondary | ICD-10-CM | POA: Diagnosis not present

## 2021-03-23 DIAGNOSIS — Z96651 Presence of right artificial knee joint: Secondary | ICD-10-CM | POA: Diagnosis not present

## 2021-03-28 DIAGNOSIS — M25561 Pain in right knee: Secondary | ICD-10-CM | POA: Diagnosis not present

## 2021-03-28 DIAGNOSIS — Z96651 Presence of right artificial knee joint: Secondary | ICD-10-CM | POA: Diagnosis not present

## 2021-03-28 DIAGNOSIS — M6281 Muscle weakness (generalized): Secondary | ICD-10-CM | POA: Diagnosis not present

## 2021-03-28 DIAGNOSIS — M25661 Stiffness of right knee, not elsewhere classified: Secondary | ICD-10-CM | POA: Diagnosis not present

## 2021-03-29 DIAGNOSIS — Z96651 Presence of right artificial knee joint: Secondary | ICD-10-CM | POA: Diagnosis not present

## 2021-04-14 ENCOUNTER — Other Ambulatory Visit: Payer: Self-pay

## 2021-04-14 MED ORDER — LORAZEPAM 1 MG PO TABS
0.5000 mg | ORAL_TABLET | Freq: Two times a day (BID) | ORAL | 4 refills | Status: DC
  Filled 2021-04-14: qty 30, 30d supply, fill #0
  Filled 2021-05-22: qty 30, 30d supply, fill #1
  Filled 2021-06-22: qty 30, 30d supply, fill #2
  Filled 2021-07-24: qty 30, 30d supply, fill #3
  Filled 2021-08-24: qty 30, 30d supply, fill #4

## 2021-04-15 IMAGING — DX DG KNEE 1-2V*R*
2 series · 2 of 2 positions shown · non-contrast
Comparison: October 13, 2014 radiographic examination; right knee
CT November 23, 2020

CLINICAL DATA: Status post total knee replacement on the right

EXAM:
RIGHT KNEE - 1-2 VIEW

[knee ap]
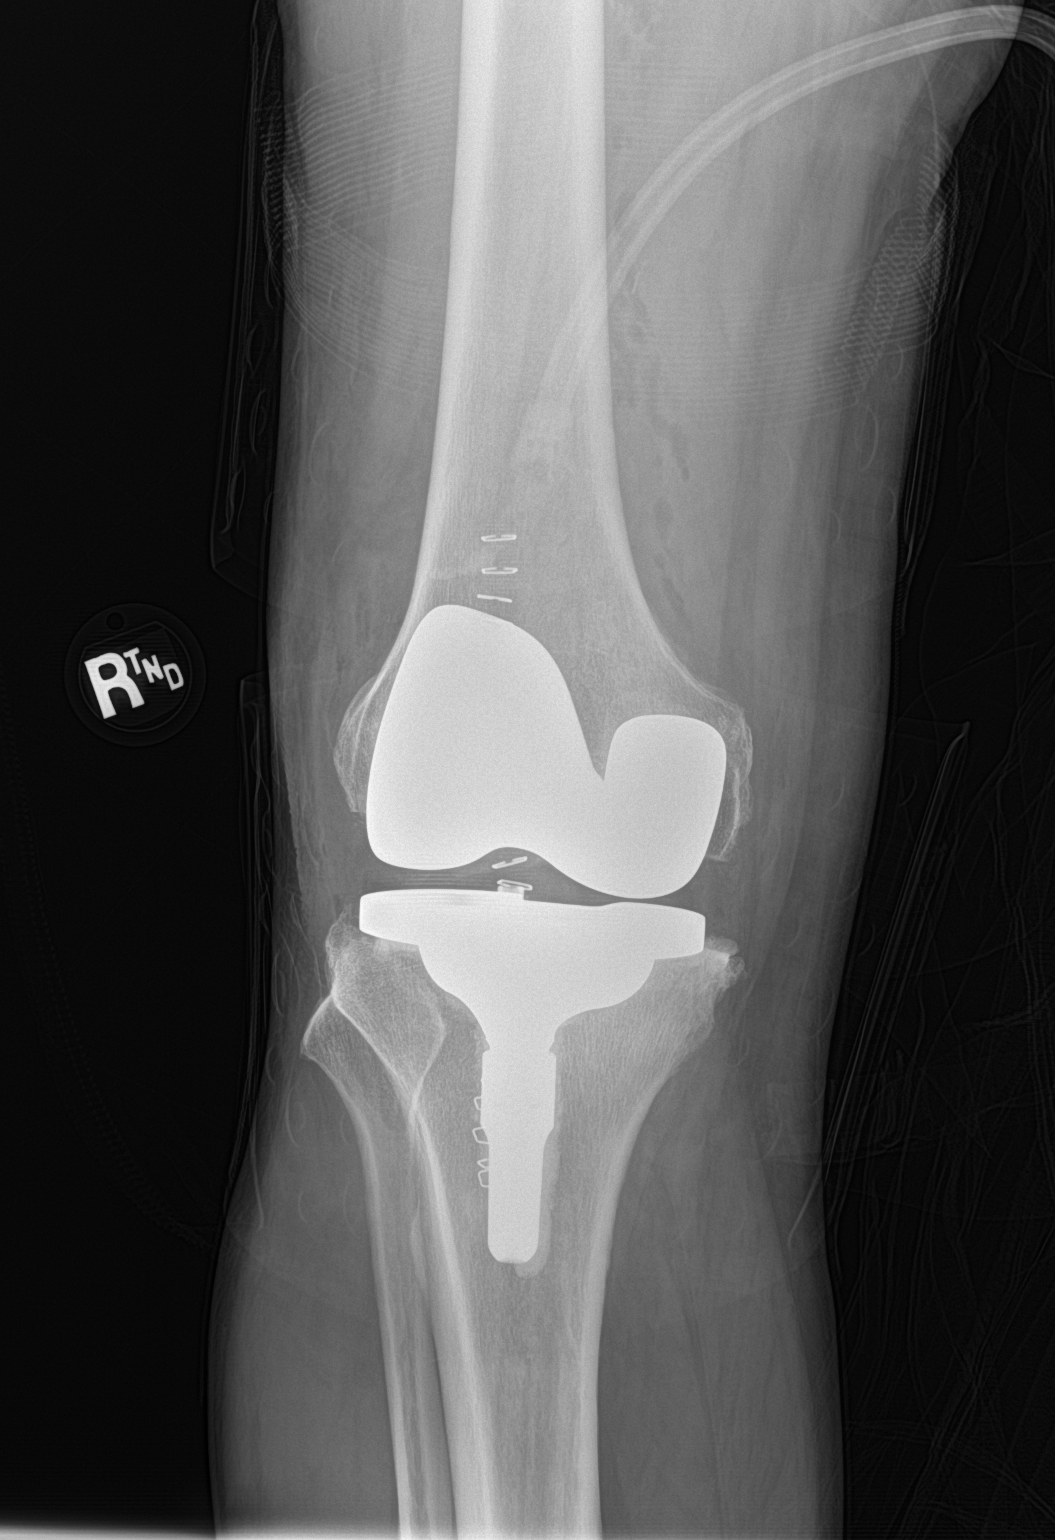

[knee lat]
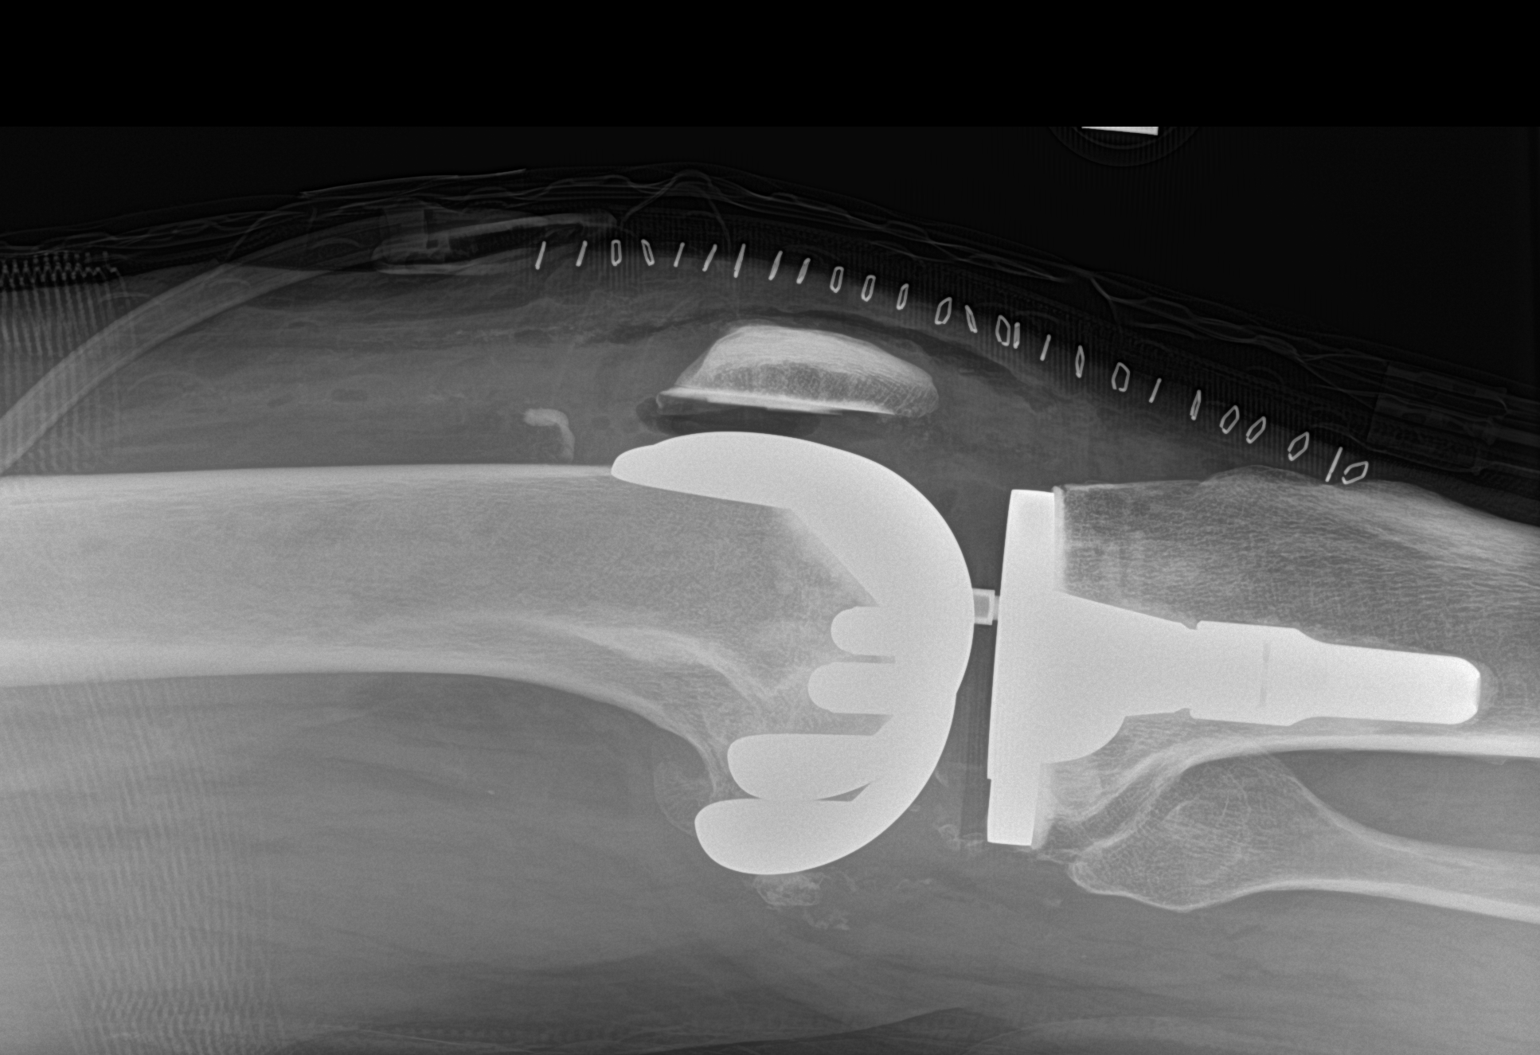

[2 of 2 positions shown; findings below may reference images not displayed]

FINDINGS: Frontal and lateral views obtained. Patient is status post total
knee replacement with femoral, tibial, and patellar prosthetic
components well-seated. No fracture or dislocation. No erosion.
Areas of soft tissue air and overlying skin staples noted.
IMPRESSION: Total knee replacement with prosthetic components well-seated. No
fracture or dislocation. Acute postoperative changes noted.

## 2021-04-24 DIAGNOSIS — E785 Hyperlipidemia, unspecified: Secondary | ICD-10-CM | POA: Diagnosis not present

## 2021-04-24 DIAGNOSIS — I10 Essential (primary) hypertension: Secondary | ICD-10-CM | POA: Diagnosis not present

## 2021-04-24 DIAGNOSIS — E119 Type 2 diabetes mellitus without complications: Secondary | ICD-10-CM | POA: Diagnosis not present

## 2021-05-01 DIAGNOSIS — E119 Type 2 diabetes mellitus without complications: Secondary | ICD-10-CM | POA: Diagnosis not present

## 2021-05-01 DIAGNOSIS — I10 Essential (primary) hypertension: Secondary | ICD-10-CM | POA: Diagnosis not present

## 2021-05-01 DIAGNOSIS — E785 Hyperlipidemia, unspecified: Secondary | ICD-10-CM | POA: Diagnosis not present

## 2021-05-12 ENCOUNTER — Other Ambulatory Visit: Payer: Self-pay

## 2021-05-12 MED ORDER — QUINAPRIL HCL 40 MG PO TABS
ORAL_TABLET | Freq: Every day | ORAL | 3 refills | Status: DC
  Filled 2021-05-12: qty 90, 90d supply, fill #0
  Filled 2021-08-15: qty 90, 90d supply, fill #1
  Filled 2021-11-08: qty 90, 90d supply, fill #2
  Filled 2022-02-07: qty 90, 90d supply, fill #3

## 2021-05-12 MED FILL — Amlodipine Besylate Tab 10 MG (Base Equivalent): ORAL | 90 days supply | Qty: 90 | Fill #0 | Status: AC

## 2021-05-16 ENCOUNTER — Other Ambulatory Visit: Payer: Self-pay

## 2021-05-16 MED FILL — Levocetirizine Dihydrochloride Tab 5 MG: ORAL | 90 days supply | Qty: 90 | Fill #0 | Status: AC

## 2021-05-16 MED FILL — Atorvastatin Calcium Tab 20 MG (Base Equivalent): ORAL | 90 days supply | Qty: 90 | Fill #0 | Status: AC

## 2021-05-22 ENCOUNTER — Other Ambulatory Visit: Payer: Self-pay

## 2021-06-15 ENCOUNTER — Other Ambulatory Visit: Payer: Self-pay

## 2021-06-15 ENCOUNTER — Emergency Department: Payer: 59

## 2021-06-15 ENCOUNTER — Emergency Department
Admission: EM | Admit: 2021-06-15 | Discharge: 2021-06-15 | Disposition: A | Discharge status: Home / Self Care | Payer: 59 | Attending: Student in an Organized Health Care Education/Training Program | Admitting: Student in an Organized Health Care Education/Training Program

## 2021-06-15 ENCOUNTER — Telehealth: Payer: Self-pay

## 2021-06-15 DIAGNOSIS — N200 Calculus of kidney: Secondary | ICD-10-CM | POA: Diagnosis not present

## 2021-06-15 DIAGNOSIS — I10 Essential (primary) hypertension: Secondary | ICD-10-CM | POA: Diagnosis not present

## 2021-06-15 DIAGNOSIS — E119 Type 2 diabetes mellitus without complications: Secondary | ICD-10-CM | POA: Diagnosis not present

## 2021-06-15 DIAGNOSIS — Z96651 Presence of right artificial knee joint: Secondary | ICD-10-CM | POA: Diagnosis not present

## 2021-06-15 DIAGNOSIS — Z87442 Personal history of urinary calculi: Secondary | ICD-10-CM

## 2021-06-15 DIAGNOSIS — Z7984 Long term (current) use of oral hypoglycemic drugs: Secondary | ICD-10-CM | POA: Diagnosis not present

## 2021-06-15 DIAGNOSIS — R1031 Right lower quadrant pain: Secondary | ICD-10-CM | POA: Diagnosis not present

## 2021-06-15 DIAGNOSIS — Z87891 Personal history of nicotine dependence: Secondary | ICD-10-CM | POA: Diagnosis not present

## 2021-06-15 DIAGNOSIS — Z79899 Other long term (current) drug therapy: Secondary | ICD-10-CM | POA: Insufficient documentation

## 2021-06-15 LAB — URINALYSIS, COMPLETE (UACMP) WITH MICROSCOPIC
Bacteria, UA: NONE SEEN
Bilirubin Urine: NEGATIVE
Glucose, UA: NEGATIVE mg/dL
Ketones, ur: NEGATIVE mg/dL
Leukocytes,Ua: NEGATIVE
Nitrite: NEGATIVE
Protein, ur: 30 mg/dL — AB
Specific Gravity, Urine: 1.041 — ABNORMAL HIGH (ref 1.005–1.030)
Squamous Epithelial / HPF: NONE SEEN (ref 0–5)
pH: 5 (ref 5.0–8.0)

## 2021-06-15 LAB — COMPREHENSIVE METABOLIC PANEL
ALT: 16 U/L (ref 0–44)
AST: 23 U/L (ref 15–41)
Albumin: 4.2 g/dL (ref 3.5–5.0)
Alkaline Phosphatase: 47 U/L (ref 38–126)
Anion gap: 12 (ref 5–15)
BUN: 12 mg/dL (ref 8–23)
CO2: 26 mmol/L (ref 22–32)
Calcium: 8.9 mg/dL (ref 8.9–10.3)
Chloride: 98 mmol/L (ref 98–111)
Creatinine, Ser: 0.84 mg/dL (ref 0.61–1.24)
GFR, Estimated: >60 mL/min (ref >60)
Glucose, Bld: 107 mg/dL — ABNORMAL HIGH (ref 70–99)
Potassium: 4.1 mmol/L (ref 3.5–5.1)
Sodium: 136 mmol/L (ref 135–145)
Total Bilirubin: 0.6 mg/dL (ref 0.3–1.2)
Total Protein: 7.3 g/dL (ref 6.5–8.1)

## 2021-06-15 LAB — CBC
HCT: 43.6 % (ref 39.0–52.0)
Hemoglobin: 15.3 g/dL (ref 13.0–17.0)
MCH: 31.5 pg (ref 26.0–34.0)
MCHC: 35.1 g/dL (ref 30.0–36.0)
MCV: 89.7 fL (ref 80.0–100.0)
Platelets: 226 10*3/uL (ref 150–400)
RBC: 4.86 MIL/uL (ref 4.22–5.81)
RDW: 13 % (ref 11.5–15.5)
WBC: 9 10*3/uL (ref 4.0–10.5)
nRBC: 0 % (ref 0.0–0.2)

## 2021-06-15 LAB — LIPASE, BLOOD: Lipase: 40 U/L (ref 11–51)

## 2021-06-15 MED ORDER — KETOROLAC TROMETHAMINE 30 MG/ML IJ SOLN
15.0000 mg | Freq: Once | INTRAMUSCULAR | Status: AC
  Administered 2021-06-15: 15 mg via INTRAVENOUS
  Filled 2021-06-15: qty 1

## 2021-06-15 MED ORDER — IOHEXOL 350 MG/ML SOLN
100.0000 mL | Freq: Once | INTRAVENOUS | Status: AC | PRN
  Administered 2021-06-15: 100 mL via INTRAVENOUS

## 2021-06-15 MED ORDER — ONDANSETRON HCL 4 MG PO TABS
4.0000 mg | ORAL_TABLET | Freq: Every day | ORAL | 0 refills | Status: DC | PRN
  Filled 2021-06-15: qty 14, 14d supply, fill #0

## 2021-06-15 MED ORDER — OXYCODONE-ACETAMINOPHEN 5-325 MG PO TABS
1.0000 | ORAL_TABLET | ORAL | 0 refills | Status: DC | PRN
  Filled 2021-06-15: qty 12, 2d supply, fill #0

## 2021-06-15 MED ORDER — TAMSULOSIN HCL 0.4 MG PO CAPS
0.4000 mg | ORAL_CAPSULE | Freq: Every day | ORAL | 0 refills | Status: DC
  Filled 2021-06-15: qty 10, 10d supply, fill #0

## 2021-06-15 NOTE — Telephone Encounter (Signed)
Called pt no answer. Left detailed message informing pt of appt day and time. UA and KUB orders placed.

## 2021-06-15 NOTE — Discharge Instructions (Addendum)
Stop taking Aspirin and Nsaids Sunday night.  Return for fevers or worsening pain.

## 2021-06-15 NOTE — ED Triage Notes (Signed)
Pt  c/o RLQ pain since friday, denies N/V/D.

## 2021-06-15 NOTE — ED Provider Notes (Signed)
[facility] Emergency Department Provider Note    Event Date/Time   First MD Initiated Contact with Patient 06/15/21 [phone]     (approximate)  I have reviewed the triage vital signs and the nursing notes.   HISTORY  Chief Complaint Abdominal Pain    HPI CAYTON CUEVAS is a 60 y.o. male below listed past medical history presents to the ER for evaluation of several days of what was initially intermittent but now progressively worsening right flank and side pain and right lower quadrant pain as well.  No history of appendicitis.  No known history of diverticulitis.  No measured fevers or chills.  No pain radiating through to his back.  Denies any trauma.  Past Medical History:  Diagnosis Date   Aneurysm of leg, right (HCC)    Anxiety    Arthritis    Blood clot in vein    left leg; Tx Oral medication   Diabetes mellitus without complication (HCC)    Hyperlipemia    Hypertension    PONV (postoperative nausea and vomiting)    No family history on file. Past Surgical History:  Procedure Laterality Date   BYPASS GRAFT POPLITEAL TO POPLITEAL Left 11/16/2015   Procedure: BYPASS GRAFT POPLITEAL TO POPLITEAL; POPLITEAL ARTERY ANEURYSM EXCLUSION;  Surgeon: Fransisco Hertz, MD;  Location: [facility] OR;  Service: Vascular;  Laterality: Left;   INNER EAR SURGERY Bilateral    several   KNEE SURGERY Right 1996   TOTAL KNEE ARTHROPLASTY Right 02/14/2021   Procedure: TOTAL KNEE ARTHROPLASTY - [place] Neighbor to Assist;  Surgeon: Kennedy Bucker, MD;  Location: [facility] ORS;  Service: Orthopedics;  Laterality: Right;   VEIN HARVEST Left 11/16/2015   Procedure: VEIN HARVEST - LEFT GREATER SAPPHENOUS;  Surgeon: Fransisco Hertz, MD;  Location: [facility] OR;  Service: Vascular;  Laterality: Left;   Patient Active Problem List   Diagnosis Date Noted   Diabetes (HCC) 02/22/2021   S/P TKR (total knee replacement) using cement, right 02/14/2021   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 Authorizing Provider  ondansetron (ZOFRAN) 4 MG tablet Take 1 tablet (4 mg total) by mouth daily as needed. 06/15/21 06/15/22 Yes Willy Eddy, MD  oxyCODONE-acetaminophen (PERCOCET) 5-325 MG tablet Take 1 tablet by mouth every 4 (four) hours as needed for severe pain. 06/15/21 06/15/22 Yes Willy Eddy, MD  tamsulosin (FLOMAX) 0.4 MG CAPS capsule Take 1 capsule (0.4 mg total) by mouth daily after supper. 06/15/21  Yes Willy Eddy, MD  acetaminophen (TYLENOL) 500 MG tablet Take 500 mg by mouth every 8 (eight) hours as needed for moderate pain.    [provider]  amLODipine (NORVASC) 10 MG tablet Take 10 mg by mouth daily. 09/28/15   [provider]  amLODipine (NORVASC) 10 MG tablet TAKE 1 TABLET BY MOUTH ONCE DAILY 05/12/20 08/10/21  Jaclyn Shaggy, MD  atorvastatin (LIPITOR) 20 MG tablet Take 20 mg by mouth daily. 08/29/15   [provider]  atorvastatin (LIPITOR) 20 MG tablet TAKE 1 TABLET BY MOUTH DAILY EVERY EVENING 02/06/21 02/06/22  Jaclyn Shaggy, MD  docusate sodium (COLACE) 100 MG capsule Take 1 capsule (100 mg total) by mouth 2 (two) times daily. 02/16/21   Evon Slack, PA-C  enoxaparin (LOVENOX) 40 MG/0.4ML injection INJECT 0.4 ML (40MG  TOTAL) INTO THE SKIN  DAILY FOR 14 DAYS 02/16/21 02/16/22  Evon Slack, PA-C  levocetirizine (XYZAL) 5 MG tablet Take 5 mg by mouth daily.    [provider]  levocetirizine (XYZAL) 5 MG tablet TAKE 1 TABLET BY MOUTH ONCE DAILY 07/22/20 08/14/21  Jaclyn Shaggy, MD  LORazepam (ATIVAN) 1 MG tablet Take 1 mg by mouth every 8 (eight) hours as needed for anxiety. PRN 08/29/15   [provider]  LORazepam (ATIVAN) 1 MG tablet TAKE 1/2 TABLET BY MOUTH TWICE A DAY 04/14/21     magnesium oxide (MAG-OX) 400 MG tablet Take 400 mg by mouth daily.    [provider]  metFORMIN  (GLUCOPHAGE) 500 MG tablet Take 1 tablet (500 mg total) by mouth 2 (two) times daily with a meal. Patient taking differently: Take 1,000 mg by mouth 2 (two) times daily with a meal. 2 tablets in the morning and 2 at night 11/17/15   Rhyne, Samantha J, PA-C  metFORMIN (GLUCOPHAGE) 500 MG tablet TAKE 2 TABLETS BY MOUTH 2 TIMES A DAY 02/06/21 02/06/22  Jaclyn Shaggy, MD  methocarbamol (ROBAXIN) 500 MG tablet TAKE 1 TABLET BY MOUTH EVERY 6 HOURS AS NEEDED FOR MUSCLE SPASMS 02/16/21 02/16/22  Evon Slack, PA-C  oxyCODONE (OXY IR/ROXICODONE) 5 MG immediate release tablet TAKE 1 TABLET (5 MG TOTAL) BY MOUTH EVERY 6 (SIX) HOURS AS NEEDED FOR PAIN FOR UP TO 5 DAYS 02/28/21 08/27/21  Evon Slack, PA-C  oxyCODONE (OXY IR/ROXICODONE) 5 MG immediate release tablet TAKE 1 TO 2 TABLETS BY MOUTH EVERY 4 HOURS AS NEEDED FOR MODERATE PAIN (PAIN SCORE 4-6) 02/16/21 08/15/21  Evon Slack, PA-C  quinapril (ACCUPRIL) 40 MG tablet Take 40 mg by mouth daily.    [provider]  quinapril (ACCUPRIL) 40 MG tablet TAKE 1 TABLET BY MOUTH ONCE DAILY 05/12/21 05/12/22    traMADol (ULTRAM) 50 MG tablet TAKE 1 TABLET BY MOUTH EVERY 6 HOURS AS NEEDED 02/16/21 08/15/21  Evon Slack, PA-C  TRUE METRIX BLOOD GLUCOSE TEST test strip  11/28/15   [provider]  TRUEPLUS LANCETS 30G MISC  11/28/15   [provider]  Turmeric 500 MG CAPS Take 500 mg by mouth in the morning and at bedtime.    [provider]    Allergies Patient has no known allergies.    Social History Social History   Tobacco Use   Smoking status: Former    Packs/day: 0.25    Years: 30.00    Pack years: 7.50    Types: Cigarettes   Smokeless tobacco: Never  Vaping Use   Vaping Use: Never used  Substance Use Topics   Alcohol use: Yes    Alcohol/week: 1.0 - 2.0 standard drink    Types: 1 - 2 Shots of liquor per week    Comment: DAILY   Drug use: No    Review of Systems Patient denies headaches, rhinorrhea,  blurry vision, numbness, shortness of breath, chest pain, edema, cough, abdominal pain, nausea, vomiting, diarrhea, dysuria, fevers, rashes or hallucinations unless otherwise stated above in HPI. ____________________________________________   PHYSICAL EXAM:  VITAL SIGNS: Vitals:   06/15/21 1030 06/15/21 1100  BP: (!) 136/96 (!) 132/97  Pulse: 86 89  Resp:  16  Temp:    SpO2: 98% 98%    Constitutional: Alert and oriented.  Eyes: Conjunctivae are normal.  Head: Atraumatic. Nose: No congestion/rhinnorhea. Mouth/Throat: Mucous membranes are moist.   Neck: No stridor. Painless ROM.  Cardiovascular: Normal rate, regular  rhythm. Grossly normal heart sounds.  Good peripheral circulation. Respiratory: Normal respiratory effort.  No retractions. Lungs CTAB. Gastrointestinal: Soft with mild ttp in right lq and right side. No distention. No abdominal bruits. No CVA tenderness. Genitourinary:  Musculoskeletal: No lower extremity tenderness nor edema.  No joint effusions. Neurologic:  Normal speech and language. No gross focal neurologic deficits are appreciated. No facial droop Skin:  Skin is warm, dry and intact. No rash noted. Psychiatric: Mood and affect are normal. Speech and behavior are normal.  ____________________________________________   LABS (all labs ordered are listed, but only abnormal results are displayed)  Results for orders placed or performed during the hospital encounter of 06/15/21 (from the past 24 hour(s))  Lipase, blood     Status: None   Collection Time: 06/15/21  9:41 AM  Result Value Ref Range   Lipase 40 11 - 51 U/L  Comprehensive metabolic panel     Status: Abnormal   Collection Time: 06/15/21  9:41 AM  Result Value Ref Range   Sodium 136 135 - 145 mmol/L   Potassium 4.1 3.5 - 5.1 mmol/L   Chloride 98 98 - 111 mmol/L   CO2 26 22 - 32 mmol/L   Glucose, Bld 107 (H) 70 - 99 mg/dL   BUN 12 8 - 23 mg/dL   Creatinine, Ser 9.16 0.61 - 1.24 mg/dL   Calcium  8.9 8.9 - 38.4 mg/dL   Total Protein 7.3 6.5 - 8.1 g/dL   Albumin 4.2 3.5 - 5.0 g/dL   AST 23 15 - 41 U/L   ALT 16 0 - 44 U/L   Alkaline Phosphatase 47 38 - 126 U/L   Total Bilirubin 0.6 0.3 - 1.2 mg/dL   GFR, Estimated >66 >59 mL/min   Anion gap 12 5 - 15  CBC     Status: None   Collection Time: 06/15/21  9:41 AM  Result Value Ref Range   WBC 9.0 4.0 - 10.5 K/uL   RBC 4.86 4.22 - 5.81 MIL/uL   Hemoglobin 15.3 13.0 - 17.0 g/dL   HCT 93.5 70.1 - 77.9 %   MCV 89.7 80.0 - 100.0 fL   MCH 31.5 26.0 - 34.0 pg   MCHC 35.1 30.0 - 36.0 g/dL   RDW 39.0 30.0 - 92.3 %   Platelets 226 150 - 400 K/uL   nRBC 0.0 0.0 - 0.2 %  Urinalysis, Complete w Microscopic Urine, Clean Catch     Status: Abnormal   Collection Time: 06/15/21 11:00 AM  Result Value Ref Range   Color, Urine YELLOW (A) YELLOW   APPearance CLEAR (A) CLEAR   Specific Gravity, Urine 1.041 (H) 1.005 - 1.030   pH 5.0 5.0 - 8.0   Glucose, UA NEGATIVE NEGATIVE mg/dL   Hgb urine dipstick SMALL (A) NEGATIVE   Bilirubin Urine NEGATIVE NEGATIVE   Ketones, ur NEGATIVE NEGATIVE mg/dL   Protein, ur 30 (A) NEGATIVE mg/dL   Nitrite NEGATIVE NEGATIVE   Leukocytes,Ua NEGATIVE NEGATIVE   RBC / HPF 0-5 0 - 5 RBC/hpf   WBC, UA 0-5 0 - 5 WBC/hpf   Bacteria, UA NONE SEEN NONE SEEN   Squamous Epithelial / LPF NONE SEEN 0 - 5   Mucus PRESENT    Hyaline Casts, UA PRESENT    ____________________________________________ ____________________________________________  RADIOLOGY  I personally reviewed all radiographic images ordered to evaluate for the above acute complaints and reviewed radiology reports and findings.  These findings were personally discussed with the patient.  Please  see medical record for radiology report.  ____________________________________________   PROCEDURES  Procedure(s) performed:  Procedures    Critical Care performed: no ____________________________________________   INITIAL IMPRESSION / ASSESSMENT AND  PLAN / ED COURSE  Pertinent labs & imaging results that were available during my care of the patient were reviewed by me and considered in my medical decision making (see chart for details).   DDX: appendicitis, diverticulitis, colitis, stone, pyelo, aaa, msk strain  Estell HarpinKeith A Dorion is a 60 y.o. who presents to the ED with right-sided abdominal pain as described above.  We will check blood work urine and order CT scan.  CT imaging shows evidence of right-sided ureterolithiasis with hydroureter.  Clinical Course as of 06/15/21 1212  Thu Jun 15, 2021  1203 Discussed case in consultation with urology who will follow patient and clinic next week to evaluate for lithotripsy.  He is not having signs of infection his pain is controlled at this time.  No fevers.  We discussed strict return precautions.  Have discussed with the patient and available family all diagnostics and treatments performed thus far and all questions were answered to the best of my ability. The patient demonstrates understanding and agreement with plan.  [PR]    Clinical Course User Index [PR] Willy Eddyobinson, Kayce Chismar, MD    The patient was evaluated in Emergency Department today for the symptoms described in the history of present illness. He/she was evaluated in the context of the global COVID-19 pandemic, which necessitated consideration that the patient might be at risk for infection with the SARS-CoV-2 virus that causes COVID-19. Institutional protocols and algorithms that pertain to the evaluation of patients at risk for COVID-19 are in a state of rapid change based on information released by regulatory bodies including the CDC and federal and state organizations. These policies and algorithms were followed during the patient's care in the ED.  As part of my medical decision making, I reviewed the following data within the electronic MEDICAL RECORD NUMBER Nursing notes reviewed and incorporated, Labs reviewed, notes from prior ED visits  and [place] Controlled Substance Database   ____________________________________________   FINAL CLINICAL IMPRESSION(S) / ED DIAGNOSES  Final diagnoses:  Kidney stone      NEW MEDICATIONS STARTED DURING THIS VISIT:  New Prescriptions   ONDANSETRON (ZOFRAN) 4 MG TABLET    Take 1 tablet (4 mg total) by mouth daily as needed.   OXYCODONE-ACETAMINOPHEN (PERCOCET) 5-325 MG TABLET    Take 1 tablet by mouth every 4 (four) hours as needed for severe pain.   TAMSULOSIN (FLOMAX) 0.4 MG CAPS CAPSULE    Take 1 capsule (0.4 mg total) by mouth daily after supper.     Note:  This document was prepared using Dragon voice recognition software and may include unintentional dictation errors.    Willy Eddyobinson, Itsel Opfer, MD 06/15/21 1212

## 2021-06-15 NOTE — Telephone Encounter (Signed)
-----   Message from Sondra Come, MD sent at 06/15/2021 11:49 AM EDT ----- Regarding: folllow up Please schedule follow-up in [place] on Tuesday 7/12 at 1145 with UA and KUB prior to discuss kidney stone, thank  Legrand Rams, MD 06/15/2021

## 2021-06-16 ENCOUNTER — Other Ambulatory Visit: Payer: Self-pay

## 2021-06-16 MED FILL — Metformin HCl Tab 500 MG: ORAL | 90 days supply | Qty: 360 | Fill #1 | Status: AC

## 2021-06-20 ENCOUNTER — Ambulatory Visit
Admission: RE | Admit: 2021-06-20 | Discharge: 2021-06-20 | Disposition: A | Discharge status: Home / Self Care | Payer: 59 | Source: Ambulatory Visit | Attending: Urology | Admitting: Urology

## 2021-06-20 ENCOUNTER — Other Ambulatory Visit
Admission: RE | Admit: 2021-06-20 | Discharge: 2021-06-20 | Disposition: A | Discharge status: Home / Self Care | Payer: 59 | Source: Home / Self Care | Attending: Urology | Admitting: Urology

## 2021-06-20 ENCOUNTER — Other Ambulatory Visit: Payer: Self-pay

## 2021-06-20 ENCOUNTER — Other Ambulatory Visit: Payer: Self-pay | Admitting: *Deleted

## 2021-06-20 ENCOUNTER — Encounter: Payer: Self-pay | Admitting: Urology

## 2021-06-20 ENCOUNTER — Ambulatory Visit: Payer: 59 | Admitting: Urology

## 2021-06-20 ENCOUNTER — Ambulatory Visit
Admission: RE | Admit: 2021-06-20 | Discharge: 2021-06-20 | Disposition: A | Discharge status: Home / Self Care | Payer: 59 | Attending: Urology | Admitting: Urology

## 2021-06-20 VITALS — BP 127/81 | HR 116 | Ht 69.0 in | Wt 198.0 lb

## 2021-06-20 DIAGNOSIS — N200 Calculus of kidney: Secondary | ICD-10-CM | POA: Diagnosis not present

## 2021-06-20 DIAGNOSIS — Z87442 Personal history of urinary calculi: Secondary | ICD-10-CM | POA: Diagnosis not present

## 2021-06-20 DIAGNOSIS — R109 Unspecified abdominal pain: Secondary | ICD-10-CM | POA: Diagnosis not present

## 2021-06-20 LAB — URINALYSIS, COMPLETE (UACMP) WITH MICROSCOPIC
Bilirubin Urine: NEGATIVE
Glucose, UA: NEGATIVE mg/dL
Ketones, ur: NEGATIVE mg/dL
Leukocytes,Ua: NEGATIVE
Nitrite: NEGATIVE
Protein, ur: 100 mg/dL — AB
Specific Gravity, Urine: 1.015 (ref 1.005–1.030)
pH: 5 (ref 5.0–8.0)

## 2021-06-20 NOTE — H&P (View-Only) (Signed)
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  He was evaluated in the ER on 06/15/2021 and CT showed a 9 mm right proximal ureteral stone with upstream hydronephrosis.  Lab work was benign and he was discharged with close urology follow-up.  He continues to have intermittent right-sided pain.  He reports 1 prior kidney stone that passed spontaneously, and has never required surgery.  He has not taken any NSAIDs, and last baby aspirin was 06/17/2021.  Urinalysis today with 6-10 WBCs and few bacteria.  KUB today shows persistent calcification over the right UPJ.   PMH: Past Medical History:  Diagnosis Date   Aneurysm of leg, right (HCC)    Anxiety    Arthritis    Blood clot in vein    left leg; Tx Oral medication   Diabetes mellitus without complication (HCC)    Hyperlipemia    Hypertension    PONV (postoperative nausea and vomiting)     Surgical History: Past Surgical History:  Procedure Laterality Date   BYPASS GRAFT POPLITEAL TO POPLITEAL Left 11/16/2015   Procedure: BYPASS GRAFT POPLITEAL TO POPLITEAL; POPLITEAL ARTERY ANEURYSM EXCLUSION;  Surgeon: Fransisco Hertz, MD;  Location: [facility] OR;  Service: Vascular;  Laterality: Left;   INNER EAR SURGERY Bilateral    several   KNEE SURGERY Right 1996   TOTAL KNEE ARTHROPLASTY Right 02/14/2021   Procedure: TOTAL KNEE ARTHROPLASTY - [place] Neighbor to Assist;  Surgeon: Kennedy Bucker, MD;  Location: [facility] ORS;  Service: Orthopedics;  Laterality: Right;   VEIN HARVEST Left 11/16/2015   Procedure: VEIN HARVEST - LEFT GREATER SAPPHENOUS;  Surgeon: Fransisco Hertz, MD;  [facility] OR;  Service: Vascular;  Laterality: Left;    Family History: No family history on file.  Social History:  reports that he has quit smoking. His smoking use included cigarettes. He has a 7.50 pack-year smoking  history. He has never used smokeless tobacco. He reports current alcohol use of about 1.0 - 2.0 standard drink of alcohol per week. He reports that he does not use drugs.  Physical Exam: BP 127/81   Pulse (!) 116   Ht 5\' 9"  (1.753 m)   Wt 198 lb (89.8 kg)   BMI 29.24 kg/m    Constitutional:  Alert and oriented, No acute distress. Cardiovascular: No clubbing, cyanosis, or edema. Respiratory: Normal respiratory effort, no increased work of breathing. GI: Abdomen is soft, nontender, nondistended, no abdominal masses  Laboratory Data: Reviewed, see HPI  Pertinent Imaging: I have personally viewed and interpreted the CT and KUB that shows a 9 mm right proximal ureteral stone, 1100HU, 13 cm SSD, clearly seen on KUB and unchanged location of the UPJ.  Assessment & Plan:   60 year old male with 9 mm right UPJ stone for at least 2 weeks, no evidence of infection. We discussed various treatment options for urolithiasis including observation with or without medical expulsive therapy, shockwave lithotripsy (SWL), ureteroscopy and laser lithotripsy with stent placement, and percutaneous nephrolithotomy.  We discussed that management is based on stone size, location, density, patient co-morbidities, and patient preference.   Stones <22mm in size have a >80% spontaneous passage rate. Data surrounding the use of tamsulosin for medical expulsive therapy is controversial, but meta analyses suggests it is most efficacious for distal stones between 5-48mm in size. Possible side effects include dizziness/lightheadedness,  and retrograde ejaculation.  SWL has a lower stone free rate in a single procedure, but also a lower complication rate compared to ureteroscopy and avoids a stent and associated stent related symptoms. Possible complications include renal hematoma, steinstrasse, and need for additional treatment.  Ureteroscopy with laser lithotripsy and stent placement has a higher stone free rate than SWL  in a single procedure, however increased complication rate including possible infection, ureteral injury, bleeding, and stent related morbidity. Common stent related symptoms include dysuria, urgency/frequency, and flank pain.  After an extensive discussion of the risks and benefits of the above treatment options, the patient would like to proceed with right shockwave lithotripsy.  Legrand Rams, MD 06/20/2021  [facility] [address], [address] [place], [place] [address] [phone]

## 2021-06-20 NOTE — Patient Instructions (Signed)
[employer] (25th ed., pp. 811-816). [place], [place]: [employer], Else[employer]. Retrieved from https://www.clinicalkey.com/#!/content/book/3-[email or web address]-B9781455750177001264?[email or web address]">  Lithotripsy  Lithotripsy is a treatment that can help break up kidney stones that are too large to pass on their own. This is a nonsurgical procedure that crushes a kidney stone with shock waves. These shock waves pass through your body and focus on the kidney stone. They cause the kidney stone to break up into smaller pieces while it is still in the urinary tract. The smaller pieces of stone canpass more easily out of your body in the urine. Tell a health care provider about: Any allergies you have. All medicines you are taking, including vitamins, herbs, eye drops, creams, and over-the-counter medicines. Any problems you or family members have had with anesthetic medicines. Any blood disorders you have. Any surgeries you have had. Any medical conditions you have. Whether you are pregnant or may be pregnant. What are the risks? Generally, this is a safe procedure. However, problems may occur, including: Infection. Bleeding from the kidney. Bruising of the kidney or skin. Scarring of the kidney, which can lead to: Increased blood pressure. Poor kidney function. Return (recurrence) of kidney stones. Damage to other structures or organs, such as the liver, colon, spleen, or pancreas. Blockage (obstruction) of the tube that carries urine from the kidney to the bladder (ureter). Failure of the kidney stone to break into pieces (fragments). What happens before the procedure? Staying hydrated Follow instructions from your health care provider about hydration, which may include: Up to 2 hours before the procedure - you may continue to drink clear liquids, such as water, clear fruit juice, black coffee, and plain tea. Eating and drinking restrictions Follow instructions from your health care provider  about eating and drinking, which may include: 8 hours before the procedure - stop eating heavy meals or foods, such as meat, fried foods, or fatty foods. 6 hours before the procedure - stop eating light meals or foods, such as toast or cereal. 6 hours before the procedure - stop drinking milk or drinks that contain milk. 2 hours before the procedure - stop drinking clear liquids. Medicines Ask your health care provider about: Changing or stopping your regular medicines. This is especially important if you are taking diabetes medicines or blood thinners. Taking medicines such as aspirin and ibuprofen. These medicines can thin your blood. Do not take these medicines unless your health care provider tells you to take them. Taking over-the-counter medicines, vitamins, herbs, and supplements. Tests You may have tests, such as: Blood tests. Urine tests. Imaging tests, such as a CT scan. General instructions Plan to have someone take you home from the hospital or clinic. If you will be going home right after the procedure, plan to have someone with you for 24 hours. Ask your health care provider what steps will be taken to help prevent infection. These may include washing skin with a germ-killing soap. What happens during the procedure?  An IV will be inserted into one of your veins. You will be given one or more of the following: A medicine to help you relax (sedative). A medicine to make you fall asleep (general anesthetic). A water-filled cushion may be placed behind your kidney or on your abdomen. In some cases, you may be placed in a tub of lukewarm water. Your body will be positioned in a way that makes it easy to target the kidney stone. An X-ray or ultrasound exam will be done to locate your stone. Shock waves will be aimed   at the stone. If you are awake, you may feel a tapping sensation as the shock waves pass through your body. A flexible tube with holes in it (stent) may be placed in  the ureter. This will help keep urine flowing from the kidney if the fragments of the stone have been blocking the ureter. The procedure may vary among health care providers and hospitals. What happens after the procedure? You may have an X-ray to see whether the procedure was able to break up the kidney stone and how much of the stone has passed. If large stone fragments remain after treatment, you may need to have a second procedure at a later time. Your blood pressure, heart rate, breathing rate, and blood oxygen level will be monitored until you leave the hospital or clinic. You may be given antibiotics or pain medicine as needed. If a stent was placed in your ureter during surgery, it may stay in place for a few weeks. You may need to strain your urine to collect pieces of the kidney stone for testing. You will need to drink plenty of water. If you were given a sedative during the procedure, it can affect you for several hours. Do not drive or operate machinery until your health care provider says that it is safe. Summary Lithotripsy is a treatment that can help break up kidney stones that are too large to pass on their own. Lithotripsy is a nonsurgical procedure that crushes a kidney stone with shock waves. Generally, this is a safe procedure. However, problems may occur, including damage to the kidney or other organs, infection, or obstruction of the tube that carries urine from the kidney to the bladder (ureter). You may have a stent placed in your ureter to help drain your urine. This stent may stay in place for a few weeks. After the procedure, you will need to drink plenty of water. You may be asked to strain your urine to collect pieces of the kidney stone for testing. This information is not intended to replace advice given to you by your health care provider. Make sure you discuss any questions you have with your healthcare provider. Document Revised: 09/08/2019 Document Reviewed:  09/09/2019 Elsevier Patient Education  2022 [employer] Inc.  

## 2021-06-20 NOTE — Progress Notes (Signed)
 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  He was evaluated in the ER on 06/15/2021 and CT showed a 9 mm right proximal ureteral stone with upstream hydronephrosis.  Lab work was benign and he was discharged with close urology follow-up.  He continues to have intermittent right-sided pain.  He reports 1 prior kidney stone that passed spontaneously, and has never required surgery.  He has not taken any NSAIDs, and last baby aspirin was 06/17/2021.  Urinalysis today with 6-10 WBCs and few bacteria.  KUB today shows persistent calcification over the right UPJ.   PMH: Past Medical History:  Diagnosis Date   Aneurysm of leg, right (HCC)    Anxiety    Arthritis    Blood clot in vein    left leg; Tx Oral medication   Diabetes mellitus without complication (HCC)    Hyperlipemia    Hypertension    PONV (postoperative nausea and vomiting)     Surgical History: Past Surgical History:  Procedure Laterality Date   BYPASS GRAFT POPLITEAL TO POPLITEAL Left 11/16/2015   Procedure: BYPASS GRAFT POPLITEAL TO POPLITEAL; POPLITEAL ARTERY ANEURYSM EXCLUSION;  Surgeon: Dalary Hollar L Chen, MD;  Location: [facility];  Service: Vascular;  Laterality: Left;   INNER EAR SURGERY Bilateral    several   KNEE SURGERY Right 1996   TOTAL KNEE ARTHROPLASTY Right 02/14/2021   Procedure: TOTAL KNEE ARTHROPLASTY - Chris Gaines to Assist;  Surgeon: Menz, Michael, MD;  Location: [facility] ORS;  Service: Orthopedics;  Laterality: Right;   VEIN HARVEST Left 11/16/2015   Procedure: VEIN HARVEST - LEFT GREATER SAPPHENOUS;  Surgeon: Kary Sugrue L Chen, MD;  Location: [facility] OR;  Service: Vascular;  Laterality: Left;    Family History: No family history on file.  Social History:  reports that he has quit smoking. His smoking use included cigarettes. He has a 7.50 pack-year smoking  history. He has never used smokeless tobacco. He reports current alcohol use of about 1.0 - 2.0 standard drink of alcohol per week. He reports that he does not use drugs.  Physical Exam: BP 127/81   Pulse (!) 116   Ht 5' 9" (1.753 m)   Wt 198 lb (89.8 kg)   BMI 29.24 kg/m    Constitutional:  Alert and oriented, No acute distress. Cardiovascular: No clubbing, cyanosis, or edema. Respiratory: Normal respiratory effort, no increased work of breathing. GI: Abdomen is soft, nontender, nondistended, no abdominal masses  Laboratory Data: Reviewed, see HPI  Pertinent Imaging: I have personally viewed and interpreted the CT and KUB that shows a 9 mm right proximal ureteral stone, 1100HU, 13 cm SSD, clearly seen on KUB and unchanged location of the UPJ.  Assessment & Plan:   60-year-old male with 9 mm right UPJ stone for at least 2 weeks, no evidence of infection. We discussed various treatment options for urolithiasis including observation with or without medical expulsive therapy, shockwave lithotripsy (SWL), ureteroscopy and laser lithotripsy with stent placement, and percutaneous nephrolithotomy.  We discussed that management is based on stone size, location, density, patient co-morbidities, and patient preference.   Stones <5mm in size have a >80% spontaneous passage rate. Data surrounding the use of tamsulosin for medical expulsive therapy is controversial, but meta analyses suggests it is most efficacious for distal stones between 5-10mm in size. Possible side effects include dizziness/lightheadedness,   and retrograde ejaculation.  SWL has a lower stone free rate in a single procedure, but also a lower complication rate compared to ureteroscopy and avoids a stent and associated stent related symptoms. Possible complications include renal hematoma, steinstrasse, and need for additional treatment.  Ureteroscopy with laser lithotripsy and stent placement has a higher stone free rate than SWL  in a single procedure, however increased complication rate including possible infection, ureteral injury, bleeding, and stent related morbidity. Common stent related symptoms include dysuria, urgency/frequency, and flank pain.  After an extensive discussion of the risks and benefits of the above treatment options, the patient would like to proceed with right shockwave lithotripsy.  Legrand Rams, MD 06/20/2021  [facility] [address], [address] [place], [place] [address] [phone]

## 2021-06-21 ENCOUNTER — Other Ambulatory Visit: Payer: Self-pay | Admitting: Urology

## 2021-06-21 DIAGNOSIS — N200 Calculus of kidney: Secondary | ICD-10-CM

## 2021-06-21 MED ORDER — CEPHALEXIN 250 MG PO CAPS: 500.0000 mg | ORAL_CAPSULE | ORAL | Status: DC

## 2021-06-21 NOTE — Progress Notes (Signed)
Orders only

## 2021-06-22 ENCOUNTER — Ambulatory Visit
Admission: RE | Admit: 2021-06-22 | Discharge: 2021-06-22 | Disposition: A | Discharge status: Home / Self Care | Payer: 59 | Attending: Urology | Admitting: Urology

## 2021-06-22 ENCOUNTER — Encounter
Admission: RE | Disposition: A | Discharge status: Home / Self Care | Payer: Self-pay | Source: Home / Self Care | Attending: Urology

## 2021-06-22 ENCOUNTER — Encounter: Payer: Self-pay | Admitting: Urology

## 2021-06-22 ENCOUNTER — Ambulatory Visit: Payer: 59

## 2021-06-22 ENCOUNTER — Other Ambulatory Visit: Payer: Self-pay

## 2021-06-22 DIAGNOSIS — E119 Type 2 diabetes mellitus without complications: Secondary | ICD-10-CM | POA: Insufficient documentation

## 2021-06-22 DIAGNOSIS — N132 Hydronephrosis with renal and ureteral calculous obstruction: Secondary | ICD-10-CM | POA: Insufficient documentation

## 2021-06-22 DIAGNOSIS — Z87891 Personal history of nicotine dependence: Secondary | ICD-10-CM | POA: Insufficient documentation

## 2021-06-22 DIAGNOSIS — N200 Calculus of kidney: Secondary | ICD-10-CM

## 2021-06-22 DIAGNOSIS — N201 Calculus of ureter: Secondary | ICD-10-CM | POA: Diagnosis not present

## 2021-06-22 DIAGNOSIS — E669 Obesity, unspecified: Secondary | ICD-10-CM | POA: Insufficient documentation

## 2021-06-22 DIAGNOSIS — Z6829 Body mass index (BMI) 29.0-29.9, adult: Secondary | ICD-10-CM | POA: Diagnosis not present

## 2021-06-22 HISTORY — PX: EXTRACORPOREAL SHOCK WAVE LITHOTRIPSY: SHX1557

## 2021-06-22 LAB — GLUCOSE, CAPILLARY
Glucose-Capillary: 139 mg/dL — ABNORMAL HIGH (ref 70–99)
Glucose-Capillary: 131 mg/dL — ABNORMAL HIGH (ref 70–99)

## 2021-06-22 SURGERY — LITHOTRIPSY, ESWL
Anesthesia: Moderate Sedation | Laterality: Right

## 2021-06-22 MED ORDER — SODIUM CHLORIDE 0.9 % IV SOLN: INTRAVENOUS | Status: DC

## 2021-06-22 MED ORDER — TAMSULOSIN HCL 0.4 MG PO CAPS
0.4000 mg | ORAL_CAPSULE | Freq: Every day | ORAL | 0 refills | Status: DC
Start: 2021-06-22 — End: 2021-07-06
  Filled 2021-06-22: qty 14, 14d supply, fill #0

## 2021-06-22 MED ORDER — DIPHENHYDRAMINE HCL 25 MG PO CAPS
25.0000 mg | ORAL_CAPSULE | ORAL | Status: AC
  Administered 2021-06-22: 25 mg via ORAL

## 2021-06-22 MED ORDER — ONDANSETRON HCL 4 MG/2ML IJ SOLN: 4.0000 mg | Freq: Once | INTRAMUSCULAR | Status: AC | PRN

## 2021-06-22 MED ORDER — DIAZEPAM 5 MG PO TABS
ORAL_TABLET | ORAL | Status: AC
  Filled 2021-06-22: qty 2

## 2021-06-22 MED ORDER — DIAZEPAM 5 MG PO TABS
10.0000 mg | ORAL_TABLET | ORAL | Status: AC
  Administered 2021-06-22: 10 mg via ORAL

## 2021-06-22 MED ORDER — ONDANSETRON HCL 4 MG/2ML IJ SOLN
INTRAMUSCULAR | Status: AC
  Administered 2021-06-22: 4 mg via INTRAVENOUS
  Filled 2021-06-22: qty 2

## 2021-06-22 MED ORDER — DIPHENHYDRAMINE HCL 25 MG PO CAPS
ORAL_CAPSULE | ORAL | Status: AC
  Filled 2021-06-22: qty 1

## 2021-06-22 MED ORDER — OXYCODONE-ACETAMINOPHEN 5-325 MG PO TABS
1.0000 | ORAL_TABLET | ORAL | 0 refills | Status: DC | PRN
  Filled 2021-06-22: qty 14, 3d supply, fill #0

## 2021-06-22 NOTE — Interval H&P Note (Signed)
History and Physical Interval Note:  06/22/2021 9:42 AM  Douglas Anthony  has presented today for surgery, with the diagnosis of Kidney stone.  The various methods of treatment have been discussed with the patient and family. After consideration of risks, benefits and other options for treatment, the patient has consented to  Procedure(s): EXTRACORPOREAL SHOCK WAVE LITHOTRIPSY (ESWL) (Right) as a surgical intervention.  The patient's history has been reviewed, patient examined, no change in status, stable for surgery.  I have reviewed the patient's chart and labs.  Questions were answered to the patient's satisfaction.     Tationa Stech C Colby Reels

## 2021-06-22 NOTE — Discharge Instructions (Addendum)
Prescriptions for pain medication and Flomax were sent to [facility] pharmacy.  The Flomax will help you pass stone fragments Additional discharge instructions as per the [facility] packet Call [facility] [phone] for any questions  AMBULATORY SURGERY  DISCHARGE INSTRUCTIONS   The drugs that you were given will stay in your system until tomorrow so for the next 24 hours you should not:  Drive an automobile Make any legal decisions Drink any alcoholic beverage   You may resume regular meals tomorrow.  Today it is better to start with liquids and gradually work up to solid foods.  You may eat anything you prefer, but it is better to start with liquids, then soup and crackers, and gradually work up to solid foods.   Please notify your doctor immediately if you have any unusual bleeding, trouble breathing, redness and pain at the surgery site, drainage, fever, or pain not relieved by medication.    Additional Instructions:        Please contact your physician with any problems or Same Day Surgery at [phone], Monday through Friday 6 am to 4 pm, or [place] at [facility] number at [phone].

## 2021-06-23 ENCOUNTER — Other Ambulatory Visit: Payer: Self-pay

## 2021-06-23 ENCOUNTER — Other Ambulatory Visit: Payer: Self-pay | Admitting: *Deleted

## 2021-06-23 DIAGNOSIS — N200 Calculus of kidney: Secondary | ICD-10-CM

## 2021-07-06 ENCOUNTER — Ambulatory Visit
Admission: RE | Admit: 2021-07-06 | Discharge: 2021-07-06 | Disposition: A | Discharge status: Home / Self Care | Payer: 59 | Source: Ambulatory Visit | Attending: Physician Assistant | Admitting: Physician Assistant

## 2021-07-06 ENCOUNTER — Ambulatory Visit
Admission: RE | Admit: 2021-07-06 | Discharge: 2021-07-06 | Disposition: A | Discharge status: Home / Self Care | Payer: 59 | Attending: Physician Assistant | Admitting: Physician Assistant

## 2021-07-06 ENCOUNTER — Ambulatory Visit (INDEPENDENT_AMBULATORY_CARE_PROVIDER_SITE_OTHER): Payer: 59 | Admitting: Physician Assistant

## 2021-07-06 ENCOUNTER — Other Ambulatory Visit: Payer: Self-pay

## 2021-07-06 ENCOUNTER — Encounter: Payer: Self-pay | Admitting: Physician Assistant

## 2021-07-06 VITALS — BP 123/82 | HR 93 | Ht 69.0 in | Wt 194.0 lb

## 2021-07-06 DIAGNOSIS — N201 Calculus of ureter: Secondary | ICD-10-CM | POA: Diagnosis not present

## 2021-07-06 DIAGNOSIS — N200 Calculus of kidney: Secondary | ICD-10-CM | POA: Insufficient documentation

## 2021-07-06 DIAGNOSIS — R109 Unspecified abdominal pain: Secondary | ICD-10-CM | POA: Diagnosis not present

## 2021-07-06 MED ORDER — TAMSULOSIN HCL 0.4 MG PO CAPS
0.4000 mg | ORAL_CAPSULE | Freq: Every day | ORAL | 0 refills | Status: DC
  Filled 2021-07-06: qty 14, 14d supply, fill #0

## 2021-07-06 NOTE — Patient Instructions (Signed)
For the next 4 weeks, please do the following: -Take Flomax 0.4mg  daily as prescribed today -Stay well hydrated -Strain your urine to catch any stones that pass -Treat any pain with ibuprofen/tylenol or Percocet  I will plan to see you back in clinic in 2 weeks with another x-ray prior to see if you have passed the rest of your stone.  Please call our office immediately (we are open 8a-5p Monday-Friday) or go to the Emergency Department if you develop any of the following: -Fever -Chills -Nausea and/or vomiting -Pain uncontrollable with Percocet

## 2021-07-06 NOTE — Progress Notes (Signed)
07/06/2021 9:26 AM   Douglas Anthony [birth year] [id number]  CC: Chief Complaint  Patient presents with   Nephrolithiasis   HPI: Douglas Anthony is a 60 y.o. male with PMH diabetes who underwent ESWL with Dr. Lonna Cobb 14 days ago for management of a 9 mm proximal right ureteral stone who presents today for postop follow-up.  Operative note significant for smudging of the stone.  Today he reports he has passed 3 small fragments with straining his urine, but does not believe he has passed the entire stone.  He has been pushing fluids including water and cranberry juice.  He has been taking Flomax, but skipped this last night.  He has been mostly pain-free, but he notes he tends to get some abdominal pain around 1 PM.  He poorly tolerates Percocet and so has been avoiding taking this.  KUB today with interval migration of a residual 6 mm fragment in the distal right ureter.  In-office UA today positive for trace ketones, 1+ blood, and 2+ protein; urine microscopy with 6-10 WBCs/HPF, 3-10 RBCs/HPF, and granular casts.   PMH: Past Medical History:  Diagnosis Date   Aneurysm of leg, right (HCC)    Anxiety    Arthritis    Blood clot in vein    left leg; Tx Oral medication   Diabetes mellitus without complication (HCC)    Hyperlipemia    Hypertension    PONV (postoperative nausea and vomiting)     Surgical History: Past Surgical History:  Procedure Laterality Date   BYPASS GRAFT POPLITEAL TO POPLITEAL Left 11/16/2015   Procedure: BYPASS GRAFT POPLITEAL TO POPLITEAL; POPLITEAL ARTERY ANEURYSM EXCLUSION;  Surgeon: Fransisco Hertz, MD;  Location: [facility];  Service: Vascular;  Laterality: Left;   EXTRACORPOREAL SHOCK WAVE LITHOTRIPSY Right 06/22/2021   Procedure: EXTRACORPOREAL SHOCK WAVE LITHOTRIPSY (ESWL);  Surgeon: Riki Altes, MD;  Location: [facility] ORS;  Service: Urology;  Laterality: Right;   INNER EAR SURGERY Bilateral    several   KNEE SURGERY Right 1996   TOTAL KNEE ARTHROPLASTY  Right 02/14/2021   Procedure: TOTAL KNEE ARTHROPLASTY - [place]ston Neighbor to Assist;  Surgeon: Kennedy Bucker, MD;  Location: [facility] ORS;  Service: Orthopedics;  Laterality: Right;   VEIN HARVEST Left 11/16/2015   Procedure: VEIN HARVEST - LEFT GREATER SAPPHENOUS;  Surgeon: Fransisco Hertz, MD;  Location: [facility] OR;  Service: Vascular;  Laterality: Left;    Home Medications:  Allergies as of 07/06/2021   No Known Allergies      Medication List        Accurate as of July 06, 2021  9:26 AM. If you have any questions, ask your nurse or doctor.          amLODipine 10 MG tablet Commonly known as: NORVASC TAKE 1 TABLET BY MOUTH ONCE DAILY   atorvastatin 20 MG tablet Commonly known as: LIPITOR TAKE 1 TABLET BY MOUTH DAILY EVERY EVENING   levocetirizine 5 MG tablet Commonly known as: XYZAL TAKE 1 TABLET BY MOUTH ONCE DAILY   LORazepam 1 MG tablet Commonly known as: ATIVAN TAKE 1/2 TABLET BY MOUTH TWICE A DAY   magnesium oxide 400 MG tablet Commonly known as: MAG-OX Take 400 mg by mouth daily.   metFORMIN 500 MG tablet Commonly known as: GLUCOPHAGE TAKE 2 TABLETS BY MOUTH 2 TIMES A DAY   methocarbamol 500 MG tablet Commonly known as: ROBAXIN TAKE 1 TABLET BY MOUTH EVERY 6 HOURS AS NEEDED FOR MUSCLE SPASMS   ondansetron 4  MG tablet Commonly known as: Zofran Take 1 tablet (4 mg total) by mouth daily as needed.   oxyCODONE-acetaminophen 5-325 MG tablet Commonly known as: Percocet Take 1 tablet by mouth every 4 (four) hours as needed for severe pain.   quinapril 40 MG tablet Commonly known as: ACCUPRIL TAKE 1 TABLET BY MOUTH ONCE DAILY   tamsulosin 0.4 MG Caps capsule Commonly known as: FLOMAX Take 1 capsule (0.4 mg total) by mouth daily after supper.   Turmeric 500 MG Caps Take 500 mg by mouth in the morning and at bedtime.       Allergies:  No Known Allergies  Family History: No family history on file.  Social History:   reports that he has quit smoking. His smoking  use included cigarettes. He has a 7.50 pack-year smoking history. He has never used smokeless tobacco. He reports current alcohol use of about 1.0 - 2.0 standard drink of alcohol per week. He reports that he does not use drugs.  Physical Exam: BP 123/82   Pulse 93   Ht 5\' 9"  (1.753 m)   Wt 194 lb (88 kg)   BMI 28.65 kg/m   Constitutional:  Alert and oriented, no acute distress, nontoxic appearing HEENT: [place], AT Cardiovascular: No clubbing, cyanosis, or edema Respiratory: Normal respiratory effort, no increased work of breathing Skin: No rashes, bruises or suspicious lesions Neurologic: Grossly intact, no focal deficits, moving all 4 extremities Psychiatric: Normal mood and affect  Laboratory Data: Results for orders placed or performed in visit on 07/06/21  Microscopic Examination   Urine  Result Value Ref Range   WBC, UA 6-10 (A) 0 - 5 /hpf   RBC 3-10 (A) 0 - 2 /hpf   Epithelial Cells (non renal) 0-10 0 - 10 /hpf   Casts Present (A) None seen /lpf   Cast Type Granular casts (A) N/A   Bacteria, UA None seen None seen/Few  Urinalysis, Complete  Result Value Ref Range   Specific Gravity, UA 1.015 1.005 - 1.030   pH, UA 5.5 5.0 - 7.5   Color, UA Yellow Yellow   Appearance Ur Clear Clear   Leukocytes,UA Negative Negative   Protein,UA 2+ (A) Negative/Trace   Glucose, UA Negative Negative   Ketones, UA Trace (A) Negative   RBC, UA 1+ (A) Negative   Bilirubin, UA Negative Negative   Urobilinogen, Ur 0.2 0.2 - 1.0 mg/dL   Nitrite, UA Negative Negative   Microscopic Examination See below:    Pertinent Imaging: KUB, 07/06/2021: CLINICAL DATA:  Right-sided abdominal pain after lithotripsy.   EXAM: ABDOMEN - 1 VIEW   COMPARISON:  06/22/2021 and CT of the abdomen and pelvis on 06/15/2021   FINDINGS: Right-sided ureteral calculus seen previously is no longer visualized. No visible stone fragments. Bowel gas pattern is unremarkable. No calculi identified overlying the  kidneys.   IMPRESSION: Right ureteral calculus is no longer visualized.     Electronically Signed   By: 08/16/2021 M.D.   On: 07/08/2021 12:57  I personally reviewed the images referenced above and note a 75mm residual fragment in the distal right ureter.  Assessment & Plan:   1. Right ureteral stone 6 mm distal right ureteral residual fragment noted on KUB today.  UA reassuring for infection.  Counseled patient to continue Flomax and will plan for follow-up in 2 weeks with UA and KUB prior.  Patient expressed understanding. - Urinalysis, Complete - tamsulosin (FLOMAX) 0.4 MG CAPS capsule; Take 1 capsule (0.4 mg total) by  mouth daily after supper.  Dispense: 14 capsule; Refill: 0 - DG Abd 1 View; Future   Return in about 2 weeks (around 07/20/2021) for Stone f/u with UA + KUB prior.  Carman Ching, PA-C  [facility] [address] [place], [place] [address] [phone]

## 2021-07-07 LAB — URINALYSIS, COMPLETE
Bilirubin, UA: NEGATIVE
Glucose, UA: NEGATIVE
Leukocytes,UA: NEGATIVE
Nitrite, UA: NEGATIVE
Specific Gravity, UA: 1.015 (ref 1.005–1.030)
Urobilinogen, Ur: 0.2 mg/dL (ref 0.2–1.0)
pH, UA: 5.5 (ref 5.0–7.5)

## 2021-07-07 LAB — MICROSCOPIC EXAMINATION: Bacteria, UA: NONE SEEN

## 2021-07-20 ENCOUNTER — Ambulatory Visit: Payer: 59 | Admitting: Physician Assistant

## 2021-07-24 ENCOUNTER — Other Ambulatory Visit: Payer: Self-pay

## 2021-08-15 ENCOUNTER — Other Ambulatory Visit: Payer: Self-pay

## 2021-08-15 MED ORDER — AMLODIPINE BESYLATE 10 MG PO TABS
ORAL_TABLET | Freq: Every day | ORAL | 3 refills | Status: DC
  Filled 2021-08-15: qty 90, 90d supply, fill #0
  Filled 2021-11-08: qty 90, 90d supply, fill #1
  Filled 2022-02-07: qty 90, 90d supply, fill #2
  Filled 2022-05-01: qty 90, 90d supply, fill #3

## 2021-08-15 MED ORDER — LEVOCETIRIZINE DIHYDROCHLORIDE 5 MG PO TABS
ORAL_TABLET | Freq: Every day | ORAL | 3 refills | Status: DC
Start: 2021-08-15 — End: 2022-08-23
  Filled 2021-08-15: qty 90, 90d supply, fill #0
  Filled 2021-11-08: qty 90, 90d supply, fill #1
  Filled 2022-02-07: qty 90, 90d supply, fill #2
  Filled 2022-05-01: qty 90, 90d supply, fill #3

## 2021-08-15 MED FILL — Atorvastatin Calcium Tab 20 MG (Base Equivalent): ORAL | 90 days supply | Qty: 90 | Fill #1 | Status: AC

## 2021-08-16 ENCOUNTER — Other Ambulatory Visit: Payer: Self-pay

## 2021-08-21 IMAGING — CR DG ABDOMEN 1V
1 series · 2 of 2 positions shown · non-contrast
Comparison: Radiograph 06/20/2021

CLINICAL DATA: RIGHT ureteral calculus

EXAM:
ABDOMEN - 1 VIEW

[Series 1: dg abd 1 view · 0.14mm/px · 2 of 2 slices shown]
[im 1/2]
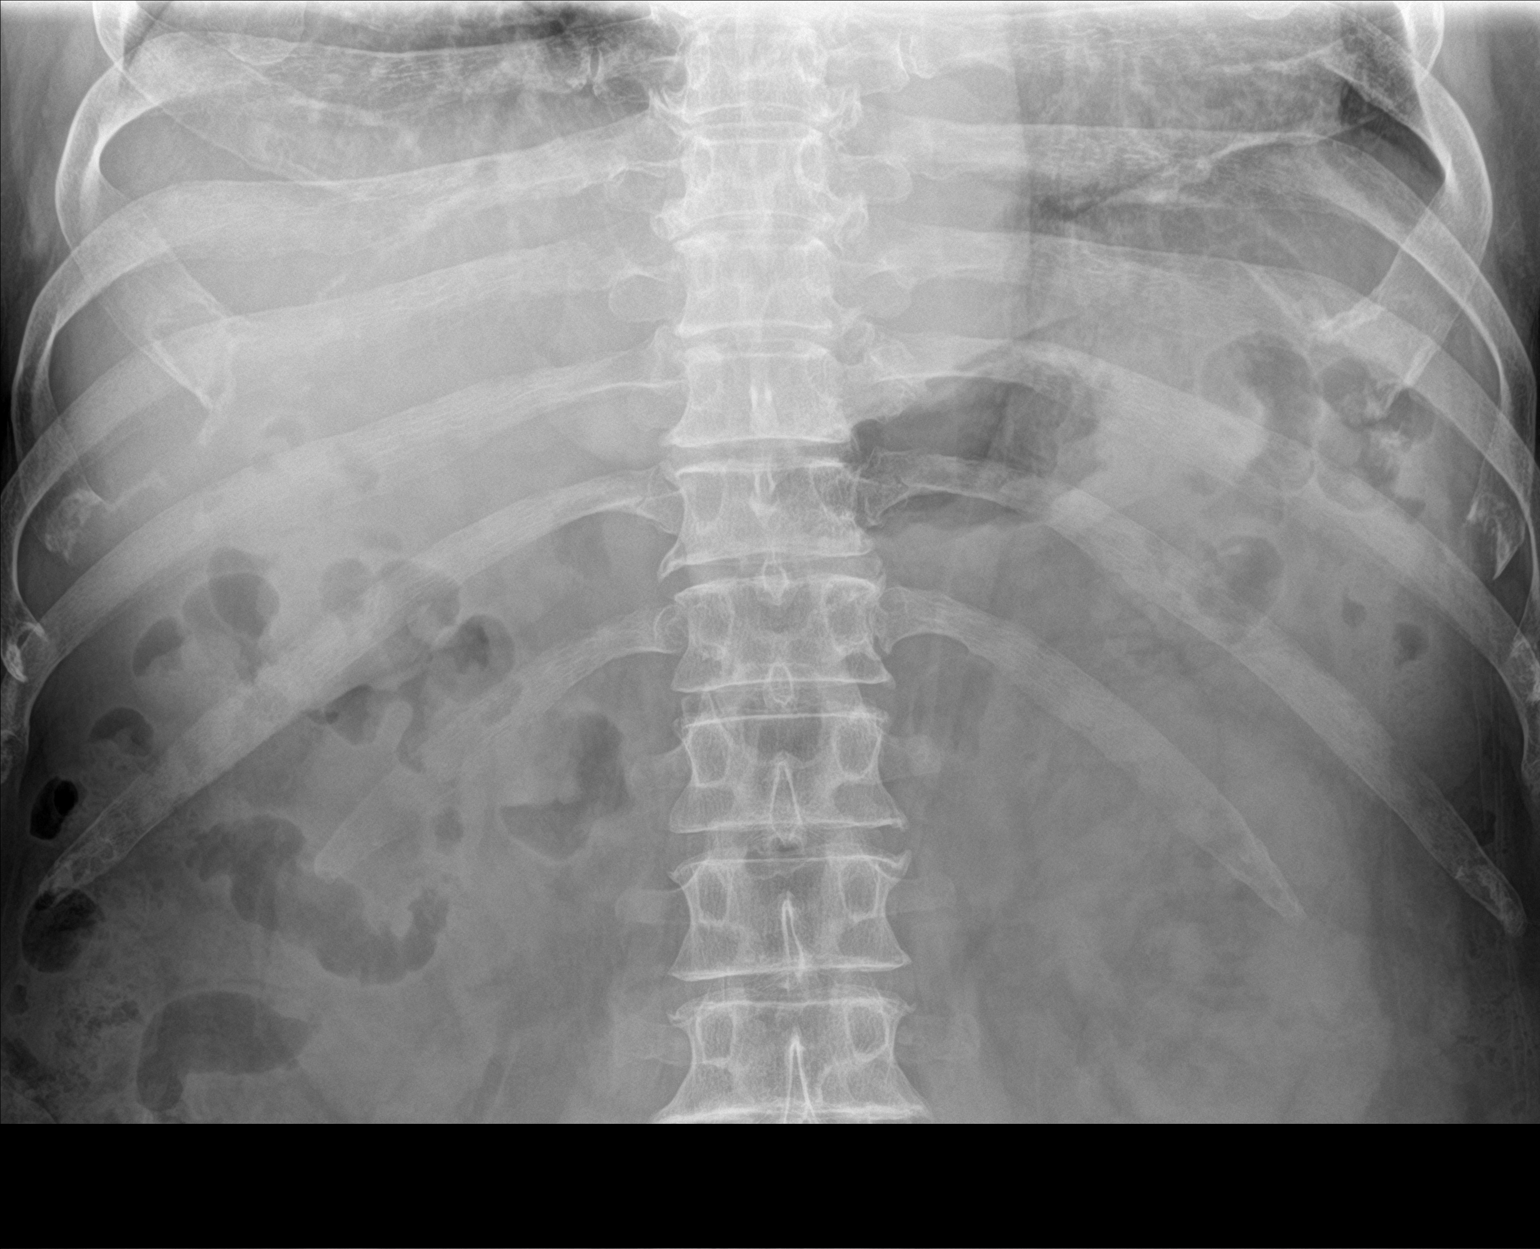
[im 2/2]
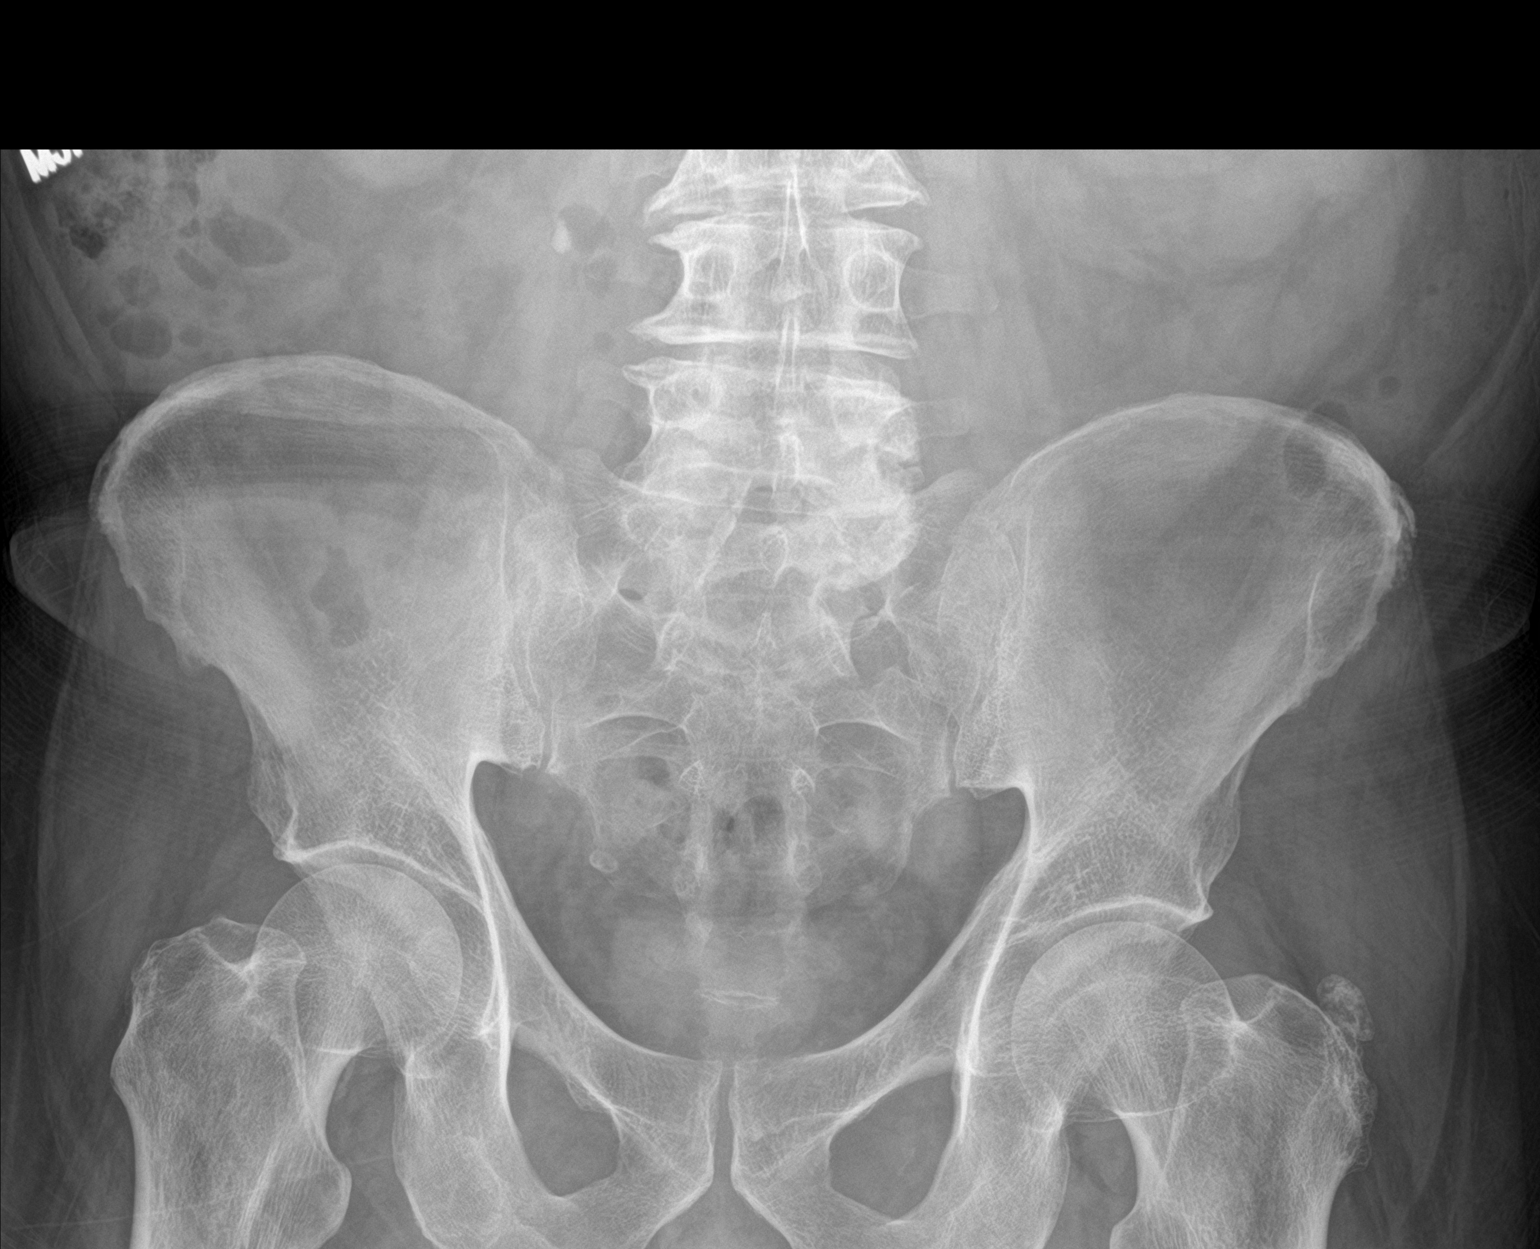

[2 of 2 positions shown; findings below may reference images not displayed]

FINDINGS: RIGHT ureteral calculus again noted. Calculus has not migrated
further into the pelvis compared to exam 2 days prior.
IMPRESSION: RIGHT ureteral calculus remains unchanged.

## 2021-08-24 ENCOUNTER — Other Ambulatory Visit: Payer: Self-pay

## 2021-09-15 ENCOUNTER — Other Ambulatory Visit: Payer: Self-pay

## 2021-09-15 MED FILL — Metformin HCl Tab 500 MG: ORAL | 90 days supply | Qty: 360 | Fill #2 | Status: AC

## 2021-09-25 ENCOUNTER — Other Ambulatory Visit: Payer: Self-pay

## 2021-09-26 ENCOUNTER — Other Ambulatory Visit: Payer: Self-pay

## 2021-09-26 MED ORDER — LORAZEPAM 1 MG PO TABS
0.5000 mg | ORAL_TABLET | Freq: Two times a day (BID) | ORAL | 4 refills | Status: DC
  Filled 2021-09-26: qty 30, 30d supply, fill #0
  Filled 2021-10-25: qty 30, 30d supply, fill #1
  Filled 2021-11-27: qty 30, 30d supply, fill #2
  Filled 2021-12-26: qty 30, 30d supply, fill #3
  Filled 2022-01-24: qty 30, 30d supply, fill #4

## 2021-10-20 DIAGNOSIS — I10 Essential (primary) hypertension: Secondary | ICD-10-CM | POA: Diagnosis not present

## 2021-10-20 DIAGNOSIS — Z125 Encounter for screening for malignant neoplasm of prostate: Secondary | ICD-10-CM | POA: Diagnosis not present

## 2021-10-20 DIAGNOSIS — E785 Hyperlipidemia, unspecified: Secondary | ICD-10-CM | POA: Diagnosis not present

## 2021-10-20 DIAGNOSIS — E119 Type 2 diabetes mellitus without complications: Secondary | ICD-10-CM | POA: Diagnosis not present

## 2021-10-25 ENCOUNTER — Other Ambulatory Visit: Payer: Self-pay

## 2021-10-25 DIAGNOSIS — E785 Hyperlipidemia, unspecified: Secondary | ICD-10-CM | POA: Diagnosis not present

## 2021-10-25 DIAGNOSIS — I10 Essential (primary) hypertension: Secondary | ICD-10-CM | POA: Diagnosis not present

## 2021-10-25 DIAGNOSIS — E119 Type 2 diabetes mellitus without complications: Secondary | ICD-10-CM | POA: Diagnosis not present

## 2021-10-31 DIAGNOSIS — H906 Mixed conductive and sensorineural hearing loss, bilateral: Secondary | ICD-10-CM | POA: Diagnosis not present

## 2021-10-31 DIAGNOSIS — Z01118 Encounter for examination of ears and hearing with other abnormal findings: Secondary | ICD-10-CM | POA: Diagnosis not present

## 2021-10-31 DIAGNOSIS — H9212 Otorrhea, left ear: Secondary | ICD-10-CM | POA: Diagnosis not present

## 2021-11-07 ENCOUNTER — Other Ambulatory Visit: Payer: Self-pay

## 2021-11-07 DIAGNOSIS — H7011 Chronic mastoiditis, right ear: Secondary | ICD-10-CM | POA: Diagnosis not present

## 2021-11-07 DIAGNOSIS — H903 Sensorineural hearing loss, bilateral: Secondary | ICD-10-CM | POA: Diagnosis not present

## 2021-11-07 DIAGNOSIS — H7012 Chronic mastoiditis, left ear: Secondary | ICD-10-CM | POA: Diagnosis not present

## 2021-11-07 MED ORDER — CIPROFLOXACIN HCL 500 MG PO TABS
ORAL_TABLET | ORAL | 0 refills | Status: DC
  Filled 2021-11-07: qty 20, 10d supply, fill #0

## 2021-11-08 ENCOUNTER — Other Ambulatory Visit: Payer: Self-pay

## 2021-11-08 MED FILL — Atorvastatin Calcium Tab 20 MG (Base Equivalent): ORAL | 90 days supply | Qty: 90 | Fill #2 | Status: AC

## 2021-11-22 DIAGNOSIS — H7013 Chronic mastoiditis, bilateral: Secondary | ICD-10-CM | POA: Diagnosis not present

## 2021-11-22 DIAGNOSIS — H906 Mixed conductive and sensorineural hearing loss, bilateral: Secondary | ICD-10-CM | POA: Diagnosis not present

## 2021-11-24 DIAGNOSIS — H7013 Chronic mastoiditis, bilateral: Secondary | ICD-10-CM | POA: Diagnosis not present

## 2021-11-24 DIAGNOSIS — H6092 Unspecified otitis externa, left ear: Secondary | ICD-10-CM | POA: Diagnosis not present

## 2021-11-24 DIAGNOSIS — Z461 Encounter for fitting and adjustment of hearing aid: Secondary | ICD-10-CM | POA: Diagnosis not present

## 2021-11-24 DIAGNOSIS — Z01118 Encounter for examination of ears and hearing with other abnormal findings: Secondary | ICD-10-CM | POA: Diagnosis not present

## 2021-11-27 ENCOUNTER — Other Ambulatory Visit: Payer: Self-pay

## 2021-12-18 ENCOUNTER — Other Ambulatory Visit: Payer: Self-pay

## 2021-12-18 MED FILL — Metformin HCl Tab 500 MG: ORAL | 90 days supply | Qty: 360 | Fill #3 | Status: AC

## 2021-12-19 ENCOUNTER — Other Ambulatory Visit: Payer: Self-pay

## 2021-12-19 DIAGNOSIS — H906 Mixed conductive and sensorineural hearing loss, bilateral: Secondary | ICD-10-CM | POA: Diagnosis not present

## 2021-12-19 DIAGNOSIS — H7012 Chronic mastoiditis, left ear: Secondary | ICD-10-CM | POA: Diagnosis not present

## 2021-12-19 MED ORDER — SULFACETAMIDE SODIUM 10 % OP SOLN
OPHTHALMIC | 5 refills | Status: DC
  Filled 2021-12-19: qty 15, 18d supply, fill #0

## 2021-12-19 MED ORDER — NEOMYCIN-POLYMYXIN-HC 3.5-10000-1 OT SUSP
OTIC | 11 refills | Status: AC
  Filled 2021-12-19: qty 10, 30d supply, fill #0
  Filled 2021-12-28: qty 10, 30d supply, fill #1

## 2021-12-20 ENCOUNTER — Other Ambulatory Visit: Payer: Self-pay

## 2021-12-26 ENCOUNTER — Other Ambulatory Visit: Payer: Self-pay

## 2021-12-27 DIAGNOSIS — H7012 Chronic mastoiditis, left ear: Secondary | ICD-10-CM | POA: Diagnosis not present

## 2021-12-27 DIAGNOSIS — H906 Mixed conductive and sensorineural hearing loss, bilateral: Secondary | ICD-10-CM | POA: Diagnosis not present

## 2021-12-28 ENCOUNTER — Other Ambulatory Visit: Payer: Self-pay

## 2022-01-01 DIAGNOSIS — Z461 Encounter for fitting and adjustment of hearing aid: Secondary | ICD-10-CM | POA: Diagnosis not present

## 2022-01-08 ENCOUNTER — Other Ambulatory Visit: Payer: Self-pay

## 2022-01-08 MED ORDER — CEPHALEXIN 500 MG PO CAPS
ORAL_CAPSULE | ORAL | 1 refills | Status: AC
  Filled 2022-01-08: qty 12, 4d supply, fill #0

## 2022-01-15 ENCOUNTER — Other Ambulatory Visit: Payer: Self-pay

## 2022-01-24 ENCOUNTER — Other Ambulatory Visit: Payer: Self-pay

## 2022-02-07 ENCOUNTER — Other Ambulatory Visit: Payer: Self-pay

## 2022-02-07 MED ORDER — ATORVASTATIN CALCIUM 20 MG PO TABS
ORAL_TABLET | ORAL | 3 refills | Status: AC
  Filled 2022-02-07: qty 90, 90d supply, fill #0
  Filled 2022-05-01: qty 90, 90d supply, fill #1
  Filled 2022-08-14: qty 90, 90d supply, fill #2
  Filled 2022-11-07: qty 30, 30d supply, fill #3
  Filled 2022-12-17: qty 30, 30d supply, fill #4

## 2022-02-07 MED ORDER — LISINOPRIL 40 MG PO TABS
ORAL_TABLET | ORAL | 3 refills | Status: AC
  Filled 2022-02-07: qty 90, 90d supply, fill #0
  Filled 2022-05-01: qty 90, 90d supply, fill #1
  Filled 2022-08-08: qty 90, 90d supply, fill #2
  Filled 2022-11-07: qty 30, 30d supply, fill #3
  Filled 2022-12-05 (×2): qty 30, 30d supply, fill #4

## 2022-02-12 ENCOUNTER — Other Ambulatory Visit: Payer: Self-pay

## 2022-02-19 ENCOUNTER — Other Ambulatory Visit: Payer: Self-pay

## 2022-02-22 ENCOUNTER — Other Ambulatory Visit: Payer: Self-pay

## 2022-02-23 ENCOUNTER — Other Ambulatory Visit: Payer: Self-pay

## 2022-02-23 MED ORDER — LORAZEPAM 1 MG PO TABS
0.5000 mg | ORAL_TABLET | Freq: Two times a day (BID) | ORAL | 1 refills | Status: DC
  Filled 2022-02-23: qty 30, 30d supply, fill #0
  Filled 2022-03-27: qty 30, 30d supply, fill #1

## 2022-03-05 ENCOUNTER — Other Ambulatory Visit: Payer: Self-pay

## 2022-03-19 DIAGNOSIS — M1712 Unilateral primary osteoarthritis, left knee: Secondary | ICD-10-CM | POA: Diagnosis not present

## 2022-03-19 DIAGNOSIS — M25562 Pain in left knee: Secondary | ICD-10-CM | POA: Diagnosis not present

## 2022-03-20 ENCOUNTER — Other Ambulatory Visit: Payer: Self-pay | Admitting: Orthopedic Surgery

## 2022-03-20 DIAGNOSIS — M1710 Unilateral primary osteoarthritis, unspecified knee: Secondary | ICD-10-CM

## 2022-03-22 ENCOUNTER — Ambulatory Visit
Admission: RE | Admit: 2022-03-22 | Discharge: 2022-03-22 | Disposition: A | Discharge status: Home / Self Care | Payer: 59 | Source: Ambulatory Visit | Attending: Orthopedic Surgery | Admitting: Orthopedic Surgery

## 2022-03-22 ENCOUNTER — Other Ambulatory Visit: Payer: Self-pay

## 2022-03-22 DIAGNOSIS — M1710 Unilateral primary osteoarthritis, unspecified knee: Secondary | ICD-10-CM | POA: Diagnosis not present

## 2022-03-22 DIAGNOSIS — M25462 Effusion, left knee: Secondary | ICD-10-CM | POA: Diagnosis not present

## 2022-03-22 DIAGNOSIS — Z01818 Encounter for other preprocedural examination: Secondary | ICD-10-CM | POA: Diagnosis not present

## 2022-03-23 ENCOUNTER — Other Ambulatory Visit: Payer: Self-pay

## 2022-03-23 MED ORDER — METFORMIN HCL 500 MG PO TABS
ORAL_TABLET | ORAL | 3 refills | Status: AC
  Filled 2022-03-23: qty 360, 90d supply, fill #0
  Filled 2022-06-26: qty 360, 90d supply, fill #1
  Filled 2022-09-27: qty 360, 90d supply, fill #2

## 2022-03-27 ENCOUNTER — Other Ambulatory Visit: Payer: Self-pay

## 2022-03-29 ENCOUNTER — Other Ambulatory Visit: Payer: Self-pay | Admitting: Orthopedic Surgery

## 2022-04-06 DIAGNOSIS — E119 Type 2 diabetes mellitus without complications: Secondary | ICD-10-CM | POA: Diagnosis not present

## 2022-04-06 DIAGNOSIS — I10 Essential (primary) hypertension: Secondary | ICD-10-CM | POA: Diagnosis not present

## 2022-04-06 DIAGNOSIS — E785 Hyperlipidemia, unspecified: Secondary | ICD-10-CM | POA: Diagnosis not present

## 2022-04-11 DIAGNOSIS — I10 Essential (primary) hypertension: Secondary | ICD-10-CM | POA: Diagnosis not present

## 2022-04-11 DIAGNOSIS — E785 Hyperlipidemia, unspecified: Secondary | ICD-10-CM | POA: Diagnosis not present

## 2022-04-11 DIAGNOSIS — E119 Type 2 diabetes mellitus without complications: Secondary | ICD-10-CM | POA: Diagnosis not present

## 2022-04-11 DIAGNOSIS — M1990 Unspecified osteoarthritis, unspecified site: Secondary | ICD-10-CM | POA: Diagnosis not present

## 2022-04-17 DIAGNOSIS — M1712 Unilateral primary osteoarthritis, left knee: Secondary | ICD-10-CM | POA: Diagnosis not present

## 2022-04-20 ENCOUNTER — Encounter
Admission: RE | Admit: 2022-04-20 | Discharge: 2022-04-20 | Disposition: A | Discharge status: Home / Self Care | Payer: 59 | Source: Ambulatory Visit | Attending: Orthopedic Surgery | Admitting: Orthopedic Surgery

## 2022-04-20 ENCOUNTER — Other Ambulatory Visit: Payer: Self-pay

## 2022-04-20 VITALS — BP 123/83 | HR 87 | Temp 98.8°F | Resp 20 | Ht 69.0 in | Wt 207.2 lb

## 2022-04-20 DIAGNOSIS — E1169 Type 2 diabetes mellitus with other specified complication: Secondary | ICD-10-CM | POA: Diagnosis not present

## 2022-04-20 DIAGNOSIS — Z01818 Encounter for other preprocedural examination: Secondary | ICD-10-CM | POA: Diagnosis not present

## 2022-04-20 DIAGNOSIS — Z794 Long term (current) use of insulin: Secondary | ICD-10-CM | POA: Diagnosis not present

## 2022-04-20 DIAGNOSIS — M25562 Pain in left knee: Secondary | ICD-10-CM | POA: Diagnosis not present

## 2022-04-20 DIAGNOSIS — Z01810 Encounter for preprocedural cardiovascular examination: Secondary | ICD-10-CM | POA: Diagnosis not present

## 2022-04-20 LAB — CBC WITH DIFFERENTIAL/PLATELET
Abs Immature Granulocytes: 0.04 10*3/uL (ref 0.00–0.07)
Basophils Absolute: 0.1 10*3/uL (ref 0.0–0.1)
Basophils Relative: 1 %
Eosinophils Absolute: 0.2 10*3/uL (ref 0.0–0.5)
Eosinophils Relative: 3 %
HCT: 43.6 % (ref 39.0–52.0)
Hemoglobin: 15.1 g/dL (ref 13.0–17.0)
Immature Granulocytes: 0 %
Lymphocytes Relative: 21 %
Lymphs Abs: 1.9 10*3/uL (ref 0.7–4.0)
MCH: 31.9 pg (ref 26.0–34.0)
MCHC: 34.6 g/dL (ref 30.0–36.0)
MCV: 92.2 fL (ref 80.0–100.0)
Monocytes Absolute: 0.4 10*3/uL (ref 0.1–1.0)
Monocytes Relative: 4 %
Neutro Abs: 6.6 10*3/uL (ref 1.7–7.7)
Neutrophils Relative %: 71 %
Platelets: 269 10*3/uL (ref 150–400)
RBC: 4.73 MIL/uL (ref 4.22–5.81)
RDW: 11.8 % (ref 11.5–15.5)
WBC: 9.3 10*3/uL (ref 4.0–10.5)
nRBC: 0 % (ref 0.0–0.2)

## 2022-04-20 LAB — COMPREHENSIVE METABOLIC PANEL
ALT: 24 U/L (ref 0–44)
AST: 25 U/L (ref 15–41)
Albumin: 4.6 g/dL (ref 3.5–5.0)
Alkaline Phosphatase: 51 U/L (ref 38–126)
Anion gap: 8 (ref 5–15)
BUN: 11 mg/dL (ref 8–23)
CO2: 24 mmol/L (ref 22–32)
Calcium: 9.3 mg/dL (ref 8.9–10.3)
Chloride: 105 mmol/L (ref 98–111)
Creatinine, Ser: 0.9 mg/dL (ref 0.61–1.24)
GFR, Estimated: >60 mL/min (ref >60)
Glucose, Bld: 102 mg/dL — ABNORMAL HIGH (ref 70–99)
Potassium: 4.4 mmol/L (ref 3.5–5.1)
Sodium: 137 mmol/L (ref 135–145)
Total Bilirubin: 0.6 mg/dL (ref 0.3–1.2)
Total Protein: 7.7 g/dL (ref 6.5–8.1)

## 2022-04-20 LAB — SURGICAL PCR SCREEN
MRSA, PCR: NEGATIVE
Staphylococcus aureus: NEGATIVE

## 2022-04-20 LAB — URINALYSIS, ROUTINE W REFLEX MICROSCOPIC
Bacteria, UA: NONE SEEN
Bilirubin Urine: NEGATIVE
Glucose, UA: NEGATIVE mg/dL
Hgb urine dipstick: NEGATIVE
Ketones, ur: NEGATIVE mg/dL
Leukocytes,Ua: NEGATIVE
Nitrite: NEGATIVE
Protein, ur: 100 mg/dL — AB
Specific Gravity, Urine: 1.009 (ref 1.005–1.030)
Squamous Epithelial / HPF: NONE SEEN (ref 0–5)
pH: 5 (ref 5.0–8.0)

## 2022-04-20 LAB — TYPE AND SCREEN
ABO/RH(D): B POS
Antibody Screen: NEGATIVE

## 2022-04-20 NOTE — Patient Instructions (Addendum)
Your procedure is scheduled on: 05/01/2022  ?Report to the Registration Desk on the 1st floor of the Medical Mall. ?To find out your arrival time, please call [phone] between 1PM - 3PM on:  04/30/2022  ?If your arrival time is 6:00 am, do not arrive prior to that time as the [facility] entrance doors do not open until 6:00 am. ? ?REMEMBER: ?Instructions that are not followed completely may result in serious medical risk, up to and including death; or upon the discretion of your surgeon and anesthesiologist your surgery may need to be rescheduled. ? ?Do not eat food after midnight the night before surgery.  ?No gum chewing, lozengers or hard candies. ? ?You may however, drink CLEAR liquids up to 2 hours before you are scheduled to arrive for your surgery. Do not drink anything within 2 hours of your scheduled arrival time. ? ?Clear liquids include: ?- water  ? ?Do NOT drink anything that is not on this list. ? ?Type 2 diabetics should only drink water. ? ?In addition, your doctor has ordered for you to drink the provided  ?Gatorade G2 ?Drinking this carbohydrate drink up to two hours before surgery helps to reduce insulin resistance and improve patient outcomes. Please complete drinking 2 hours prior to scheduled arrival time. ? ?TAKE THESE MEDICATIONS THE MORNING OF SURGERY WITH A SIP OF WATER: ?Amlodipine ?Lorazepam ? ? ? ? ?**Follow new guidelines for insulin and diabetes medications.** ?Do not take you metformin on 5/21,5/22,5/23  ? ?Follow recommendations from Cardiologist, Pulmonologist or PCP regarding stopping Aspirin. ? ?One week prior to surgery: ?Stop Anti-inflammatories (NSAIDS) such as Advil, Aleve, Ibuprofen, Motrin, Naproxen, Naprosyn and Aspirin based products such as Excedrin, Goodys Powder, BC Powder. ?Stop ANY OVER THE COUNTER supplements until after surgery like turmeric and magnesium  ?You may however, continue to take Tylenol if needed for pain up until the day of surgery. ? ?No Alcohol  for 24 hours before or after surgery. ? ?No Smoking including e-cigarettes for 24 hours prior to surgery.  ?No chewable tobacco products for at least 6 hours prior to surgery.  ?No nicotine patches on the day of surgery. ? ?Do not use any "recreational" drugs for at least a week prior to your surgery.  ?Please be advised that the combination of cocaine and anesthesia may have negative outcomes, up to and including death. ?If you test positive for cocaine, your surgery will be cancelled. ? ?On the morning of surgery brush your teeth with toothpaste and water, you may rinse your mouth with mouthwash if you wish. ?Do not swallow any toothpaste or mouthwash. ? ?Use CHG Soap or wipes as directed on instruction sheet. ? ?Do not wear jewelry, make-up, hairpins, clips or nail polish. ? ?Do not wear lotions, powders, or perfumes.  ? ?Do not shave body from the neck down 48 hours prior to surgery just in case you cut yourself which could leave a site for infection.  ?Also, freshly shaved skin may become irritated if using the CHG soap. ? ?Contact lenses, hearing aids and dentures may not be worn into surgery. ? ?Do not bring valuables to the hospital. [facility] is not responsible for any missing/lost belongings or valuables.  ? ? ?Notify your doctor if there is any change in your medical condition (cold, fever, infection). ? ?Wear comfortable clothing (specific to your surgery type) to the hospital. ? ?After surgery, you can help prevent lung complications by doing breathing exercises.  ?Take deep breaths and cough every  1-2 hours. Your doctor may order a device called an Incentive Spirometer to help you take deep breaths. ?If you are being admitted to the hospital overnight, leave your suitcase in the car. ?After surgery it may be brought to your room. ? ?If you are being discharged the day of surgery, you will not be allowed to drive home. ?You will need a responsible adult (18 years or older) to drive you home and stay  with you that night.  ? ?If you are taking public transportation, you will need to have a responsible adult (18 years or older) with you. ?Please confirm with your physician that it is acceptable to use public transportation.  ? ?Please call the Pre-admissions Testing Dept. at [phone] if you have any questions about these instructions. ? ?Surgery Visitation Policy: ? ?Patients undergoing a surgery or procedure may have two family members or support persons with them as long as the person is not COVID-19 positive or experiencing its symptoms.  ? ?Inpatient Visitation:   ? ?Visiting hours are 7 a.m. to 8 p.m. ?Up to four visitors are allowed at one time in a patient room, including children. The visitors may rotate out with other people during the day. One designated support person (adult) may remain overnight.  ?

## 2022-04-30 MED ORDER — CEFAZOLIN SODIUM-DEXTROSE 2-4 GM/100ML-% IV SOLN
2.0000 g | INTRAVENOUS | Status: AC
  Administered 2022-05-01: 2 g via INTRAVENOUS

## 2022-04-30 MED ORDER — SODIUM CHLORIDE 0.9 % IV SOLN: INTRAVENOUS | Status: DC

## 2022-04-30 MED ORDER — CHLORHEXIDINE GLUCONATE 0.12 % MT SOLN
15.0000 mL | Freq: Once | OROMUCOSAL | Status: AC
Start: 2022-04-30 — End: 2022-05-01

## 2022-04-30 MED ORDER — ORAL CARE MOUTH RINSE
15.0000 mL | Freq: Once | OROMUCOSAL | Status: AC
Start: 2022-04-30 — End: 2022-05-01

## 2022-04-30 MED ORDER — FAMOTIDINE 20 MG PO TABS: 20.0000 mg | ORAL_TABLET | Freq: Once | ORAL | Status: AC

## 2022-05-01 ENCOUNTER — Observation Stay
Admission: RE | Admit: 2022-05-01 | Discharge: 2022-05-02 | Disposition: A | Discharge status: Home Health | Payer: 59 | Source: Ambulatory Visit | Attending: Orthopedic Surgery | Admitting: Orthopedic Surgery

## 2022-05-01 ENCOUNTER — Ambulatory Visit: Payer: 59 | Admitting: Anesthesiology

## 2022-05-01 ENCOUNTER — Other Ambulatory Visit: Payer: Self-pay

## 2022-05-01 ENCOUNTER — Encounter: Payer: Self-pay | Admitting: Orthopedic Surgery

## 2022-05-01 ENCOUNTER — Observation Stay: Payer: 59

## 2022-05-01 ENCOUNTER — Ambulatory Visit: Payer: 59 | Admitting: Urgent Care

## 2022-05-01 ENCOUNTER — Encounter
Admission: RE | Disposition: A | Discharge status: Home Health | Payer: Self-pay | Source: Ambulatory Visit | Attending: Orthopedic Surgery

## 2022-05-01 DIAGNOSIS — Z96651 Presence of right artificial knee joint: Secondary | ICD-10-CM | POA: Diagnosis not present

## 2022-05-01 DIAGNOSIS — Z96652 Presence of left artificial knee joint: Secondary | ICD-10-CM | POA: Diagnosis not present

## 2022-05-01 DIAGNOSIS — Z794 Long term (current) use of insulin: Secondary | ICD-10-CM

## 2022-05-01 DIAGNOSIS — Z79899 Other long term (current) drug therapy: Secondary | ICD-10-CM | POA: Diagnosis not present

## 2022-05-01 DIAGNOSIS — M1712 Unilateral primary osteoarthritis, left knee: Secondary | ICD-10-CM | POA: Diagnosis not present

## 2022-05-01 DIAGNOSIS — Z7984 Long term (current) use of oral hypoglycemic drugs: Secondary | ICD-10-CM | POA: Insufficient documentation

## 2022-05-01 DIAGNOSIS — I10 Essential (primary) hypertension: Secondary | ICD-10-CM | POA: Insufficient documentation

## 2022-05-01 DIAGNOSIS — Z7982 Long term (current) use of aspirin: Secondary | ICD-10-CM | POA: Diagnosis not present

## 2022-05-01 DIAGNOSIS — Z23 Encounter for immunization: Secondary | ICD-10-CM | POA: Diagnosis not present

## 2022-05-01 DIAGNOSIS — Z86718 Personal history of other venous thrombosis and embolism: Secondary | ICD-10-CM | POA: Diagnosis not present

## 2022-05-01 DIAGNOSIS — E119 Type 2 diabetes mellitus without complications: Secondary | ICD-10-CM | POA: Insufficient documentation

## 2022-05-01 HISTORY — PX: TOTAL KNEE ARTHROPLASTY: SHX125

## 2022-05-01 LAB — GLUCOSE, CAPILLARY
Glucose-Capillary: 138 mg/dL — ABNORMAL HIGH (ref 70–99)
Glucose-Capillary: 122 mg/dL — ABNORMAL HIGH (ref 70–99)
Glucose-Capillary: 124 mg/dL — ABNORMAL HIGH (ref 70–99)
Glucose-Capillary: 144 mg/dL — ABNORMAL HIGH (ref 70–99)
Glucose-Capillary: 153 mg/dL — ABNORMAL HIGH (ref 70–99)

## 2022-05-01 LAB — CBC
HCT: 37.1 % — ABNORMAL LOW (ref 39.0–52.0)
Hemoglobin: 12.9 g/dL — ABNORMAL LOW (ref 13.0–17.0)
MCH: 32 pg (ref 26.0–34.0)
MCHC: 34.8 g/dL (ref 30.0–36.0)
MCV: 92.1 fL (ref 80.0–100.0)
Platelets: 211 10*3/uL (ref 150–400)
RBC: 4.03 MIL/uL — ABNORMAL LOW (ref 4.22–5.81)
RDW: 11.7 % (ref 11.5–15.5)
WBC: 7.8 10*3/uL (ref 4.0–10.5)
nRBC: 0 % (ref 0.0–0.2)

## 2022-05-01 LAB — CREATININE, SERUM
Creatinine, Ser: 0.97 mg/dL (ref 0.61–1.24)
GFR, Estimated: >60 mL/min (ref >60)

## 2022-05-01 SURGERY — ARTHROPLASTY, KNEE, TOTAL
Anesthesia: General | Site: Knee | Laterality: Left

## 2022-05-01 MED ORDER — CEFAZOLIN SODIUM-DEXTROSE 2-4 GM/100ML-% IV SOLN
INTRAVENOUS | Status: AC
  Administered 2022-05-01: 2 g via INTRAVENOUS
  Filled 2022-05-01: qty 100

## 2022-05-01 MED ORDER — GLYCOPYRROLATE 0.2 MG/ML IJ SOLN
INTRAMUSCULAR | Status: DC | PRN
  Administered 2022-05-01 (×2): .1 mg via INTRAVENOUS

## 2022-05-01 MED ORDER — LORAZEPAM 0.5 MG PO TABS
0.5000 mg | ORAL_TABLET | Freq: Two times a day (BID) | ORAL | Status: DC
  Administered 2022-05-01 – 2022-05-02 (×3): 0.5 mg via ORAL
  Filled 2022-05-01 (×3): qty 1

## 2022-05-01 MED ORDER — SODIUM CHLORIDE 0.9 % IV SOLN: INTRAVENOUS | Status: DC

## 2022-05-01 MED ORDER — BUPIVACAINE-EPINEPHRINE (PF) 0.25% -1:200000 IJ SOLN
INTRAMUSCULAR | Status: AC
Start: 2022-05-01 — End: ?
  Filled 2022-05-01: qty 60

## 2022-05-01 MED ORDER — MENTHOL 3 MG MT LOZG: 1.0000 | LOZENGE | OROMUCOSAL | Status: DC | PRN

## 2022-05-01 MED ORDER — INSULIN ASPART 100 UNIT/ML IJ SOLN
0.0000 [IU] | Freq: Three times a day (TID) | INTRAMUSCULAR | Status: DC
  Administered 2022-05-01 – 2022-05-02 (×2): 2 [IU] via SUBCUTANEOUS
  Filled 2022-05-01 (×2): qty 1

## 2022-05-01 MED ORDER — ONDANSETRON HCL 4 MG/2ML IJ SOLN: 4.0000 mg | Freq: Four times a day (QID) | INTRAMUSCULAR | Status: DC | PRN

## 2022-05-01 MED ORDER — METHOCARBAMOL 500 MG PO TABS
500.0000 mg | ORAL_TABLET | Freq: Four times a day (QID) | ORAL | Status: DC | PRN
  Administered 2022-05-01: 500 mg via ORAL
  Filled 2022-05-01: qty 1

## 2022-05-01 MED ORDER — FENTANYL CITRATE (PF) 100 MCG/2ML IJ SOLN: 25.0000 ug | INTRAMUSCULAR | Status: DC | PRN

## 2022-05-01 MED ORDER — NEOMYCIN-POLYMYXIN B GU 40-200000 IR SOLN: Status: DC | PRN

## 2022-05-01 MED ORDER — ATORVASTATIN CALCIUM 20 MG PO TABS
20.0000 mg | ORAL_TABLET | Freq: Every day | ORAL | Status: DC
  Administered 2022-05-01: 20 mg via ORAL
  Filled 2022-05-01: qty 1

## 2022-05-01 MED ORDER — POLYETHYLENE GLYCOL 3350 17 G PO PACK: 17.0000 g | PACK | Freq: Every day | ORAL | Status: DC | PRN

## 2022-05-01 MED ORDER — OXYCODONE HCL 5 MG PO TABS: 5.0000 mg | ORAL_TABLET | Freq: Once | ORAL | Status: DC | PRN

## 2022-05-01 MED ORDER — METFORMIN HCL 500 MG PO TABS
1000.0000 mg | ORAL_TABLET | Freq: Two times a day (BID) | ORAL | Status: DC
  Administered 2022-05-01 – 2022-05-02 (×2): 1000 mg via ORAL
  Filled 2022-05-01 (×2): qty 2

## 2022-05-01 MED ORDER — DIPHENHYDRAMINE HCL 12.5 MG/5ML PO ELIX: 12.5000 mg | ORAL_SOLUTION | ORAL | Status: DC | PRN

## 2022-05-01 MED ORDER — LORATADINE 10 MG PO TABS
10.0000 mg | ORAL_TABLET | Freq: Every day | ORAL | Status: DC
  Administered 2022-05-01 – 2022-05-02 (×2): 10 mg via ORAL
  Filled 2022-05-01 (×2): qty 1

## 2022-05-01 MED ORDER — PROPOFOL 1000 MG/100ML IV EMUL
INTRAVENOUS | Status: AC
  Filled 2022-05-01: qty 100

## 2022-05-01 MED ORDER — SODIUM CHLORIDE FLUSH 0.9 % IV SOLN
INTRAVENOUS | Status: AC
  Filled 2022-05-01: qty 80

## 2022-05-01 MED ORDER — DEXMEDETOMIDINE HCL IN NACL 80 MCG/20ML IV SOLN
INTRAVENOUS | Status: AC
  Filled 2022-05-01: qty 20

## 2022-05-01 MED ORDER — HYDROMORPHONE HCL 1 MG/ML IJ SOLN
0.5000 mg | INTRAMUSCULAR | Status: DC | PRN
  Administered 2022-05-01: 1 mg via INTRAVENOUS
  Filled 2022-05-01 (×2): qty 1

## 2022-05-01 MED ORDER — SODIUM CHLORIDE 0.9 % IR SOLN: Status: DC | PRN

## 2022-05-01 MED ORDER — DEXMEDETOMIDINE HCL IN NACL 200 MCG/50ML IV SOLN
INTRAVENOUS | Status: DC | PRN
  Administered 2022-05-01 (×2): 4 ug via INTRAVENOUS

## 2022-05-01 MED ORDER — TRAMADOL HCL 50 MG PO TABS
50.0000 mg | ORAL_TABLET | Freq: Four times a day (QID) | ORAL | Status: DC
  Administered 2022-05-01 – 2022-05-02 (×4): 50 mg via ORAL
  Filled 2022-05-01 (×4): qty 1

## 2022-05-01 MED ORDER — OXYCODONE HCL 5 MG PO TABS
5.0000 mg | ORAL_TABLET | ORAL | Status: DC | PRN
  Administered 2022-05-02: 10 mg via ORAL
  Filled 2022-05-01 (×2): qty 2

## 2022-05-01 MED ORDER — AMLODIPINE BESYLATE 10 MG PO TABS: 10.0000 mg | ORAL_TABLET | Freq: Every day | ORAL | Status: DC

## 2022-05-01 MED ORDER — GLYCOPYRROLATE 0.2 MG/ML IJ SOLN
INTRAMUSCULAR | Status: AC
  Filled 2022-05-01: qty 1

## 2022-05-01 MED ORDER — KETAMINE HCL 10 MG/ML IJ SOLN
INTRAMUSCULAR | Status: DC | PRN
  Administered 2022-05-01: 10 mg via INTRAVENOUS
  Administered 2022-05-01: 20 mg via INTRAVENOUS

## 2022-05-01 MED ORDER — QUINAPRIL HCL 10 MG PO TABS: 40.0000 mg | ORAL_TABLET | Freq: Every day | ORAL | Status: DC

## 2022-05-01 MED ORDER — LISINOPRIL 20 MG PO TABS
40.0000 mg | ORAL_TABLET | Freq: Every day | ORAL | Status: DC
  Administered 2022-05-01: 40 mg via ORAL
  Filled 2022-05-01: qty 2

## 2022-05-01 MED ORDER — ACETAMINOPHEN 325 MG PO TABS: 325.0000 mg | ORAL_TABLET | Freq: Four times a day (QID) | ORAL | Status: DC | PRN

## 2022-05-01 MED ORDER — METHOCARBAMOL 1000 MG/10ML IJ SOLN: 500.0000 mg | Freq: Four times a day (QID) | INTRAVENOUS | Status: DC | PRN

## 2022-05-01 MED ORDER — PANTOPRAZOLE SODIUM 40 MG PO TBEC
40.0000 mg | DELAYED_RELEASE_TABLET | Freq: Every day | ORAL | Status: DC
  Administered 2022-05-01 – 2022-05-02 (×2): 40 mg via ORAL
  Filled 2022-05-01 (×2): qty 1

## 2022-05-01 MED ORDER — ACETAMINOPHEN 10 MG/ML IV SOLN
INTRAVENOUS | Status: AC
  Filled 2022-05-01: qty 100

## 2022-05-01 MED ORDER — PROPOFOL 500 MG/50ML IV EMUL
INTRAVENOUS | Status: DC | PRN
  Administered 2022-05-01: 80 ug/kg/min via INTRAVENOUS

## 2022-05-01 MED ORDER — MAGNESIUM OXIDE 400 MG PO TABS
400.0000 mg | ORAL_TABLET | Freq: Every day | ORAL | Status: DC
  Administered 2022-05-01 – 2022-05-02 (×2): 400 mg via ORAL
  Filled 2022-05-01 (×4): qty 1

## 2022-05-01 MED ORDER — SEVOFLURANE IN SOLN
RESPIRATORY_TRACT | Status: AC
  Filled 2022-05-01: qty 250

## 2022-05-01 MED ORDER — CEFAZOLIN SODIUM-DEXTROSE 2-4 GM/100ML-% IV SOLN
2.0000 g | Freq: Four times a day (QID) | INTRAVENOUS | Status: AC
  Administered 2022-05-01: 2 g via INTRAVENOUS
  Filled 2022-05-01 (×2): qty 100

## 2022-05-01 MED ORDER — PHENYLEPHRINE HCL-NACL 20-0.9 MG/250ML-% IV SOLN
INTRAVENOUS | Status: AC
  Filled 2022-05-01: qty 250

## 2022-05-01 MED ORDER — CHLORHEXIDINE GLUCONATE 0.12 % MT SOLN
OROMUCOSAL | Status: AC
  Administered 2022-05-01: 15 mL via OROMUCOSAL
  Filled 2022-05-01: qty 15

## 2022-05-01 MED ORDER — NEOMYCIN-POLYMYXIN B GU 40-200000 IR SOLN
Status: AC
  Filled 2022-05-01: qty 20

## 2022-05-01 MED ORDER — ENOXAPARIN SODIUM 30 MG/0.3ML IJ SOSY
30.0000 mg | PREFILLED_SYRINGE | Freq: Two times a day (BID) | INTRAMUSCULAR | Status: DC
  Administered 2022-05-02: 30 mg via SUBCUTANEOUS
  Filled 2022-05-01: qty 0.3

## 2022-05-01 MED ORDER — ZOLPIDEM TARTRATE 5 MG PO TABS: 5.0000 mg | ORAL_TABLET | Freq: Every evening | ORAL | Status: DC | PRN

## 2022-05-01 MED ORDER — ACETAMINOPHEN 10 MG/ML IV SOLN: 1000.0000 mg | Freq: Once | INTRAVENOUS | Status: DC | PRN

## 2022-05-01 MED ORDER — DOCUSATE SODIUM 100 MG PO CAPS
100.0000 mg | ORAL_CAPSULE | Freq: Two times a day (BID) | ORAL | Status: DC
  Administered 2022-05-01 – 2022-05-02 (×2): 100 mg via ORAL
  Filled 2022-05-01 (×2): qty 1

## 2022-05-01 MED ORDER — MORPHINE SULFATE (PF) 10 MG/ML IV SOLN
INTRAVENOUS | Status: AC
  Filled 2022-05-01: qty 1

## 2022-05-01 MED ORDER — PHENYLEPHRINE HCL-NACL 20-0.9 MG/250ML-% IV SOLN
INTRAVENOUS | Status: DC | PRN
Start: 2022-05-01 — End: 2022-05-01
  Administered 2022-05-01: 10 ug/min via INTRAVENOUS

## 2022-05-01 MED ORDER — SURGIPHOR WOUND IRRIGATION SYSTEM - OPTIME: TOPICAL | Status: DC | PRN

## 2022-05-01 MED ORDER — BISACODYL 5 MG PO TBEC: 5.0000 mg | DELAYED_RELEASE_TABLET | Freq: Every day | ORAL | Status: DC | PRN

## 2022-05-01 MED ORDER — BUPIVACAINE HCL (PF) 0.5 % IJ SOLN
INTRAMUSCULAR | Status: DC | PRN
  Administered 2022-05-01: 2.6 mL via INTRATHECAL

## 2022-05-01 MED ORDER — BUPIVACAINE LIPOSOME 1.3 % IJ SUSP
INTRAMUSCULAR | Status: AC
  Filled 2022-05-01: qty 40

## 2022-05-01 MED ORDER — PROPOFOL 10 MG/ML IV BOLUS
INTRAVENOUS | Status: DC | PRN
  Administered 2022-05-01 (×2): 10 mg via INTRAVENOUS
  Administered 2022-05-01: 20 mg via INTRAVENOUS
  Administered 2022-05-01: 10 mg via INTRAVENOUS

## 2022-05-01 MED ORDER — ONDANSETRON HCL 4 MG PO TABS: 4.0000 mg | ORAL_TABLET | Freq: Four times a day (QID) | ORAL | Status: DC | PRN

## 2022-05-01 MED ORDER — LACTATED RINGERS IV SOLN: INTRAVENOUS | Status: DC

## 2022-05-01 MED ORDER — ONDANSETRON HCL 4 MG/2ML IJ SOLN: 4.0000 mg | Freq: Once | INTRAMUSCULAR | Status: DC | PRN

## 2022-05-01 MED ORDER — OXYCODONE HCL 5 MG/5ML PO SOLN: 5.0000 mg | Freq: Once | ORAL | Status: DC | PRN

## 2022-05-01 MED ORDER — BUPIVACAINE HCL (PF) 0.5 % IJ SOLN
INTRAMUSCULAR | Status: AC
  Filled 2022-05-01: qty 10

## 2022-05-01 MED ORDER — ACETAMINOPHEN 500 MG PO TABS
1000.0000 mg | ORAL_TABLET | Freq: Four times a day (QID) | ORAL | Status: DC
Start: 2022-05-01 — End: 2022-05-02
  Administered 2022-05-01 – 2022-05-02 (×3): 1000 mg via ORAL
  Filled 2022-05-01 (×3): qty 2

## 2022-05-01 MED ORDER — KETAMINE HCL 50 MG/5ML IJ SOSY
PREFILLED_SYRINGE | INTRAMUSCULAR | Status: AC
  Filled 2022-05-01: qty 5

## 2022-05-01 MED ORDER — LEVOCETIRIZINE DIHYDROCHLORIDE 5 MG PO TABS: 5.0000 mg | ORAL_TABLET | Freq: Every day | ORAL | Status: DC

## 2022-05-01 MED ORDER — MIDAZOLAM HCL 2 MG/2ML IJ SOLN
INTRAMUSCULAR | Status: AC
Start: 2022-05-01 — End: ?
  Filled 2022-05-01: qty 2

## 2022-05-01 MED ORDER — FLEET ENEMA 7-19 GM/118ML RE ENEM: 1.0000 | ENEMA | Freq: Once | RECTAL | Status: DC | PRN

## 2022-05-01 MED ORDER — OXYCODONE HCL 5 MG PO TABS
10.0000 mg | ORAL_TABLET | ORAL | Status: DC | PRN
  Administered 2022-05-01 (×2): 10 mg via ORAL
  Filled 2022-05-01: qty 2

## 2022-05-01 MED ORDER — ACETAMINOPHEN 10 MG/ML IV SOLN
INTRAVENOUS | Status: DC | PRN
  Administered 2022-05-01: 1000 mg via INTRAVENOUS

## 2022-05-01 MED ORDER — ASPIRIN 81 MG PO TBEC
81.0000 mg | DELAYED_RELEASE_TABLET | Freq: Every day | ORAL | Status: DC
  Administered 2022-05-01 – 2022-05-02 (×2): 81 mg via ORAL
  Filled 2022-05-01 (×2): qty 1

## 2022-05-01 MED ORDER — MIDAZOLAM HCL 5 MG/5ML IJ SOLN
INTRAMUSCULAR | Status: DC | PRN
  Administered 2022-05-01 (×2): 1 mg via INTRAVENOUS

## 2022-05-01 MED ORDER — FAMOTIDINE 20 MG PO TABS
ORAL_TABLET | ORAL | Status: AC
  Administered 2022-05-01: 20 mg via ORAL
  Filled 2022-05-01: qty 1

## 2022-05-01 MED ORDER — ALUM & MAG HYDROXIDE-SIMETH 200-200-20 MG/5ML PO SUSP: 30.0000 mL | ORAL | Status: DC | PRN

## 2022-05-01 MED ORDER — SODIUM CHLORIDE (PF) 0.9 % IJ SOLN
INTRAMUSCULAR | Status: DC | PRN
  Administered 2022-05-01: 91 mL via INTRAMUSCULAR

## 2022-05-01 MED ORDER — PHENOL 1.4 % MT LIQD: 1.0000 | OROMUCOSAL | Status: DC | PRN

## 2022-05-01 MED ORDER — PNEUMOCOCCAL VAC POLYVALENT 25 MCG/0.5ML IJ INJ
0.5000 mL | INJECTION | INTRAMUSCULAR | Status: AC
  Administered 2022-05-02: 0.5 mL via INTRAMUSCULAR
  Filled 2022-05-01 (×2): qty 0.5

## 2022-05-01 SURGICAL SUPPLY — 70 items
BLADE SAGITTAL 25.0X1.19X90 (BLADE) ×2 IMPLANT
BLADE SAW 90X13X1.19 OSCILLAT (BLADE) ×1 IMPLANT
BLOCK CUTTING FEMUR 4+ LT MED (MISCELLANEOUS) ×1 IMPLANT
BLOCK CUTTING TIBIAL 4 LT CT (MISCELLANEOUS) ×1 IMPLANT
BNDG ELASTIC 6X5.8 VLCR STR LF (GAUZE/BANDAGES/DRESSINGS) ×2 IMPLANT
CANISTER WOUND CARE 500ML ATS (WOUND CARE) ×2 IMPLANT
CEMENT HV SMART SET (Cement) ×4 IMPLANT
CHLORAPREP W/TINT 26 (MISCELLANEOUS) ×4 IMPLANT
COOLER POLAR GLACIER W/PUMP (MISCELLANEOUS) ×2 IMPLANT
CUFF TOURN SGL QUICK 24 (TOURNIQUET CUFF)
CUFF TOURN SGL QUICK 34 (TOURNIQUET CUFF)
CUFF TRNQT CYL 24X4X16.5-23 (TOURNIQUET CUFF) IMPLANT
CUFF TRNQT CYL 34X4.125X (TOURNIQUET CUFF) IMPLANT
DRAPE 3/4 80X56 (DRAPES) ×4 IMPLANT
DRSG MEPILEX SACRM 8.7X9.8 (GAUZE/BANDAGES/DRESSINGS) ×2 IMPLANT
ELECT CAUTERY BLADE 6.4 (BLADE) ×2 IMPLANT
ELECT REM PT RETURN 9FT ADLT (ELECTROSURGICAL) ×2
ELECTRODE REM PT RTRN 9FT ADLT (ELECTROSURGICAL) ×1 IMPLANT
FEMORAL COMP SZ4P LT SPHERE (Femur) ×1 IMPLANT
FEMUR BONE MODEL 4.9010 MEDACT (MISCELLANEOUS) ×1 IMPLANT
GAUZE 4X4 16PLY ~~LOC~~+RFID DBL (SPONGE) ×1 IMPLANT
GAUZE XEROFORM 1X8 LF (GAUZE/BANDAGES/DRESSINGS) ×1 IMPLANT
GLOVE BIOGEL PI IND STRL 9 (GLOVE) ×1 IMPLANT
GLOVE BIOGEL PI INDICATOR 9 (GLOVE) ×1
GLOVE SURG ORTHO 8.0 STRL STRW (GLOVE) ×2 IMPLANT
GLOVE SURG SYN 9.0  PF PI (GLOVE) ×1
GLOVE SURG SYN 9.0 PF PI (GLOVE) ×1 IMPLANT
GLOVE SURG UNDER LTX SZ8 (GLOVE) ×2 IMPLANT
GOWN SRG 2XL LVL 4 RGLN SLV (GOWNS) ×1 IMPLANT
GOWN STRL NON-REIN 2XL LVL4 (GOWNS) ×1
GOWN STRL REUS W/ TWL LRG LVL3 (GOWN DISPOSABLE) ×1 IMPLANT
GOWN STRL REUS W/ TWL XL LVL3 (GOWN DISPOSABLE) ×1 IMPLANT
GOWN STRL REUS W/TWL LRG LVL3 (GOWN DISPOSABLE) ×1
GOWN STRL REUS W/TWL XL LVL3 (GOWN DISPOSABLE) ×1
HOLDER FOLEY CATH W/STRAP (MISCELLANEOUS) ×2 IMPLANT
HOOD PEEL AWAY FLYTE STAYCOOL (MISCELLANEOUS) ×4 IMPLANT
IV NS IRRIG 3000ML ARTHROMATIC (IV SOLUTION) ×2 IMPLANT
KIT PREVENA INCISION MGT20CM45 (CANNISTER) ×2 IMPLANT
KIT TURNOVER KIT A (KITS) ×2 IMPLANT
MANIFOLD NEPTUNE II (INSTRUMENTS) ×4 IMPLANT
NDL SAFETY ECLIPSE 18X1.5 (NEEDLE) ×1 IMPLANT
NDL SPNL 20GX3.5 QUINCKE YW (NEEDLE) ×1 IMPLANT
NEEDLE HYPO 18GX1.5 SHARP (NEEDLE) ×1
NEEDLE SPNL 20GX3.5 QUINCKE YW (NEEDLE) ×2 IMPLANT
NS IRRIG 1000ML POUR BTL (IV SOLUTION) ×2 IMPLANT
PACK TOTAL KNEE (MISCELLANEOUS) ×2 IMPLANT
PAD WRAPON POLAR KNEE (MISCELLANEOUS) ×1 IMPLANT
PATELLA RESURFACING MEDACTA SZ (Bone Implant) ×1 IMPLANT
PULSAVAC PLUS IRRIG FAN TIP (DISPOSABLE) ×2
SCALPEL PROTECTED #10 DISP (BLADE) ×4 IMPLANT
SOLUTION IRRIG SURGIPHOR (IV SOLUTION) ×2 IMPLANT
STAPLER SKIN PROX 35W (STAPLE) ×2 IMPLANT
STEM EXTENSION 11MMX30MM (Stem) ×1 IMPLANT
SUCTION FRAZIER HANDLE 10FR (MISCELLANEOUS) ×1
SUCTION TUBE FRAZIER 10FR DISP (MISCELLANEOUS) ×1 IMPLANT
SUT DVC 2 QUILL PDO  T11 36X36 (SUTURE) ×1
SUT DVC 2 QUILL PDO T11 36X36 (SUTURE) ×1 IMPLANT
SUT ETHIBOND 2 V 37 (SUTURE) ×1 IMPLANT
SUT V-LOC 90 ABS DVC 3-0 CL (SUTURE) ×2 IMPLANT
SYR 20ML LL LF (SYRINGE) ×2 IMPLANT
SYR 50ML LL SCALE MARK (SYRINGE) ×4 IMPLANT
TIBIAL BONE MODEL LEFT (MISCELLANEOUS) ×1 IMPLANT
TIBIAL INSERT SZ4 LT 02120410F (Insert) ×1 IMPLANT
TIBIAL TRAY FIXED MEDACTA 0207 (Bone Implant) ×1 IMPLANT
TIP FAN IRRIG PULSAVAC PLUS (DISPOSABLE) ×1 IMPLANT
TOWEL OR 17X26 4PK STRL BLUE (TOWEL DISPOSABLE) ×1 IMPLANT
TOWER CARTRIDGE SMART MIX (DISPOSABLE) ×2 IMPLANT
TRAY FOLEY MTR SLVR 16FR STAT (SET/KITS/TRAYS/PACK) ×2 IMPLANT
WATER STERILE IRR 1000ML POUR (IV SOLUTION) ×2 IMPLANT
WRAPON POLAR PAD KNEE (MISCELLANEOUS) ×2

## 2022-05-01 NOTE — Progress Notes (Signed)
Met with the patient in the room  HE lives at home with his wife She provides transportation He has a RW and 3 in 1 at home and does not need additional DME He is set up with [facility] He can afford his medication

## 2022-05-01 NOTE — H&P (Signed)
Chief Complaint  Patient presents with   Pre-op Exam  Scheduled for left TKA 05/01/22 with Dr. Rosita Kea    History of the Present Illness: Douglas Anthony is a 61 y.o. male here today for history and physical for left total knee arthroplasty with Dr. Kennedy Bucker on 05/01/2022. Patient has x-rays from 03/19/2022 showing tricompartmental osteoarthritis with varus deformity with complete loss of joint space in the medial compartment of the left knee. Patient's left knee pain is severe, located along the medial joint line. He has a hard time sleeping at night and feels as if he is unable to work and perform his activities daily living. Despite conservative treatment with the left knee consisting of injection therapy patient has not had any relief of his left knee pain. Patient has agreed and consented to a left total knee arthroplasty with Dr. Kennedy Bucker.  The patient is employed as a Merchandiser, retail at [employer].  I have reviewed past medical, surgical, social and family history, and allergies as documented in the EMR.  Past Medical History: Past Medical History:  Diagnosis Date   Allergy   Aneurysm (CMS-HCC)   Anxiety   Arthritis   DVT (deep venous thrombosis) (CMS-HCC)  Popliteal aneurysm 2016   GERD (gastroesophageal reflux disease)   Hyperlipidemia   Hypertension   Past Surgical History: Past Surgical History:  Procedure Laterality Date   Popliteal artery bypass grafting Left   Past Family History: No family history on file.  Medications: Current Outpatient Medications Ordered in Epic  Medication Sig Dispense Refill   amLODIPine (NORVASC) 10 MG tablet Take 10 mg by mouth once daily   aspirin 81 MG EC tablet Take by mouth   atorvastatin (LIPITOR) 20 MG tablet Take 20 mg by mouth once daily   cephalexin (KEFLEX) 500 MG capsule Take 4 pills one hour prior to dental appointment. 12 capsule 1   etodolac (LODINE) 500 MG tablet Take 1 tablet (500 mg total) by mouth 2 (two) times daily. 30 tablet 0    levocetirizine (XYZAL) 5 MG tablet   lisinopriL (ZESTRIL) 40 MG tablet Take 1 tablet by mouth once daily   LORazepam (ATIVAN) 1 MG tablet Take by mouth.   metFORMIN (GLUCOPHAGE) 500 MG tablet Take 500 mg by mouth 2 (two) times daily with meals   QUINAPRIL HCL (QUINAPRIL ORAL) Take by mouth.   tamsulosin (FLOMAX) 0.4 mg capsule Take 1 capsule by mouth once daily   No current Epic-ordered facility-administered medications on file.   Allergies: No Known Allergies   Body mass index is 31.02 kg/m.  Review of Systems: A comprehensive 14 point ROS was performed, reviewed, and the pertinent orthopaedic findings are documented in the HPI.  Vitals:  04/17/22 1443  BP: 122/80    General Physical Examination:  General:  Well developed, well nourished, no apparent distress, normal affect, antalgic gait HEENT: Head normocephalic, atraumatic, PERRL.   Abdomen: Soft, non tender, non distended, Bowel sounds present.  Heart: Examination of the heart reveals regular, rate, and rhythm. There is no murmur noted on ascultation. There is a normal apical pulse.  Lungs: Lungs are clear to auscultation. There is no wheeze, rhonchi, or crackles. There is normal expansion of bilateral chest walls.   Left knee: On exam, left knee edema. Left knee has active extension to 15 degrees, passive extension to 5 degrees with pain. Left knee flexion to 105 degrees.  Radiographs: AP, lateral, standing, and sunrise x-rays of the left knee were reviewed by me today from 03/19/2022.  These show varus deformity with complete loss of medial joint space, extensive osteophytes medially and laterally with some chondrocalcinosis. Lateral view shows posterior osteophytes on the femur and tibia, as well as extensive patellofemoral changes with subchondral cyst formation. Sunrise view shows large osteophytes on the femoral trochlea and patella with some subluxation.  X-ray Impression: Advanced tricompartmental  osteoarthritis with prior right total knee.  Assessment: ICD-10-CM  1. Primary osteoarthritis of left knee M17.12   Plan:  38. 61 year old male with has tricompartmental osteoarthritis with complete loss of joint space in the medial compartment of the left knee. Patient's failed conservative treatment and pain is interfering with quality of life and activities daily living. Risks, benefits, complications of a left total knee arthroplasty have been discussed with the patient. Patient has agreed and consented procedure with Dr. Kennedy Bucker on 05/01/2022.  Surgical Risks:  The nature of the condition and the proposed procedure has been reviewed in detail with the patient. Surgical versus non-surgical options and prognosis for recovery have been reviewed and the inherent risks and benefits of each have been discussed including the risks of infection, bleeding, injury to nerves/blood vessels/tendons, incomplete relief of symptoms, persisting pain and/or stiffness, loss of function, complex regional pain syndrome, failure of the procedure, as appropriate.   Electronically signed by Patience Musca, PA at 04/17/2022 3:16 PM EDT  Back to top of Progress Notes Coralee Pesa, CMA - 04/17/2022 2:45 PM EDT Formatting of this note might be different from the original. Review of Systems  Musculoskeletal: Positive for arthralgias, gait problem and joint swelling.  All other systems reviewed and are negative.  Electronically signed by Coralee Pesa, CMA at 04/17/2022 3:16 PM EDT   Reviewed  H+P. No changes noted.

## 2022-05-01 NOTE — Evaluation (Signed)
Physical Therapy Evaluation Patient Details Name: Douglas Anthony MRN: [id number] DOB: [birth year] Today's Date: 05/01/2022  History of Present Illness  Patient is a 61 year old male s/p L total knee arthroplasty  Clinical Impression  Physical Therapy Evaluation completed this date. Patient tolerated session well and was agreeable to treatment. Upon entry into room patient was supine in bed with wife present reporting no pain. Patient's home set up/ PLOF was received from previous admission, and was reviewed for any changes with patient. Patient reported having all DME equipment from previous knee surgery (RW, 3&1, shower seat). Patient demonstrated 91 degrees of left knee flexion on evaluation, with MMT ranging from 3to3+/5. BUE and BLE sensation in-tact. All bed mobility was completed at [facility] with no physical assistance required. Patient did however utilize bed rails. Functional sit to stand transfers and ambulation (~227fet) were completed at CGA/SBA with RW for safety. No LOB noted. Patient was re-educated on proper stair sequencing, and was able to ascend/descend 4 steps with B hand rails safely at SBA/SUP. Patient was left in bed with RN and family present, and all needs met/ in reach. Patient would continue to benefit from skilled physical therapy in order to optimize patient's return to PLOF. Recommend HHPT upon discharge from acute hospitalization.      Recommendations for follow up therapy are one component of a multi-disciplinary discharge planning process, led by the attending physician.  Recommendations may be updated based on patient status, additional functional criteria and insurance authorization.  Follow Up Recommendations Home health PT    Assistance Recommended at Discharge PRN  Patient can return home with the following  A little help with walking and/or transfers;Help with stairs or ramp for entrance;A little help with bathing/dressing/bathroom    Equipment Recommendations  None recommended by PT (pt has all required DME at home)  Recommendations for Other Services       Functional Status Assessment Patient has had a recent decline in their functional status and demonstrates the ability to make significant improvements in function in a reasonable and predictable amount of time.     Precautions / Restrictions Precautions Precautions: Fall Restrictions Weight Bearing Restrictions: Yes LLE Weight Bearing: Weight bearing as tolerated      Mobility  Bed Mobility Overal bed mobility: Needs Assistance Bed Mobility: Supine to Sit, Sit to Supine     Supine to sit: Supervision, HOB elevated Sit to supine: Supervision, HOB elevated   General bed mobility comments: increased use of bed rails, no physical assistance required Patient Response: Cooperative  Transfers Overall transfer level: Needs assistance Equipment used: Rolling walker (2 wheels) Transfers: Sit to/from Stand Sit to Stand: Min guard (SBA)           General transfer comment: CGA/SBA for safety as it was patient's first time out of bed post surgery    Ambulation/Gait Ambulation/Gait assistance: Min guard (SBA) Gait Distance (Feet): 200 Feet Assistive device: Rolling walker (2 wheels) Gait Pattern/deviations: Step-through pattern, Decreased step length - right, Decreased step length - left, Decreased stride length, Narrow base of support, Antalgic Gait velocity: decreased     General Gait Details: no LOB or increase in pain noted  Stairs Stairs: Yes Stairs assistance: Supervision (SBA) Stair Management: Step to pattern, Two rails Number of Stairs: 4 General stair comments: patient educated on proper stair sequencing  Wheelchair Mobility    Modified Rankin (Stroke Patients Only)       Balance Overall balance assessment: Needs assistance Sitting-balance support: Feet supported Sitting  balance-Leahy Scale: Good     Standing balance support: Bilateral upper extremity  supported, During functional activity, Reliant on assistive device for balance Standing balance-Leahy Scale: Fair                               Pertinent Vitals/Pain Pain Assessment Pain Assessment: No/denies pain    Home Living Family/patient expects to be discharged to:: Private residence Living Arrangements: Spouse/significant other Available Help at Discharge: Family;Available 24 hours/day Type of Home: House Home Access: Stairs to enter Entrance Stairs-Rails: Right;Left;Can reach both Entrance Stairs-Number of Steps: 5     Home Equipment: Rolling Walker (2 wheels);BSC/3in1;Cane - single point;Grab bars - toilet;Shower seat      Prior Function Prior Level of Function : Independent/Modified Independent                     Hand Dominance   Dominant Hand: Right    Extremity/Trunk Assessment   Upper Extremity Assessment Upper Extremity Assessment: Overall WFL for tasks assessed    Lower Extremity Assessment Lower Extremity Assessment: LLE deficits/detail;RLE deficits/detail RLE Deficits / Details: At least 3+/5 strength LLE Deficits / Details: Decreased strength and ROM due to surgical limb    Cervical / Trunk Assessment Cervical / Trunk Assessment: Normal  Communication   Communication: No difficulties  Cognition Arousal/Alertness: Awake/alert Behavior During Therapy: WFL for tasks assessed/performed Overall Cognitive Status: Within Functional Limits for tasks assessed                                 General Comments: A&Ox3 self location and situation        General Comments      Exercises Total Joint Exercises Goniometric ROM: 91 degrees of L knee flexion Other Exercises Other Exercises: patient educated on role of PT in acute care setting, fall risk, WB precautions, and d/c recommendations   Assessment/Plan    PT Assessment Patient needs continued PT services  PT Problem List Decreased strength;Decreased range of  motion;Decreased activity tolerance;Decreased balance;Pain;Decreased mobility       PT Treatment Interventions Therapeutic activities;Gait training;Therapeutic exercise;Stair training;Balance training    PT Goals (Current goals can be found in the Care Plan section)  Acute Rehab PT Goals Patient Stated Goal: to go home PT Goal Formulation: With patient Time For Goal Achievement: 05/15/22 Potential to Achieve Goals: Good    Frequency BID     Co-evaluation               AM-PAC PT "6 Clicks" Mobility  Outcome Measure Help needed turning from your back to your side while in a flat bed without using bedrails?: A Little Help needed moving from lying on your back to sitting on the side of a flat bed without using bedrails?: A Little Help needed moving to and from a bed to a chair (including a wheelchair)?: A Little Help needed standing up from a chair using your arms (e.g., wheelchair or bedside chair)?: A Little Help needed to walk in hospital room?: A Little Help needed climbing 3-5 steps with a railing? : A Little 6 Click Score: 18    End of Session Equipment Utilized During Treatment: Gait belt Activity Tolerance: Patient tolerated treatment well Patient left: in bed;with call bell/phone within reach;with nursing/sitter in room;with family/visitor present Nurse Communication: Mobility status PT Visit Diagnosis: Unsteadiness on feet (R26.81);Muscle weakness (generalized) (M62.81);Pain  Pain - Right/Left: Left Pain - part of body: Knee    Time: 1300-1326 PT Time Calculation (min) (ACUTE ONLY): 26 min   Charges:   PT Evaluation $PT Eval Low Complexity: 1 Low PT Treatments $Therapeutic Activity: 8-22 mins       Iva Boop, PT  05/01/22. 1:46 PM

## 2022-05-01 NOTE — Anesthesia Postprocedure Evaluation (Signed)
Anesthesia Post Note  Patient: Douglas Anthony  Procedure(s) Performed: TOTAL KNEE ARTHROPLASTY (Left: Knee)  Patient location during evaluation: PACU Anesthesia Type: Spinal Level of consciousness: awake and alert, oriented and patient cooperative Pain management: pain level controlled Vital Signs Assessment: post-procedure vital signs reviewed and stable Respiratory status: spontaneous breathing, nonlabored ventilation and respiratory function stable Cardiovascular status: blood pressure returned to baseline and stable Postop Assessment: adequate PO intake, no headache and spinal receding Anesthetic complications: no   No notable events documented.   Last Vitals:  Vitals:   05/01/22 1045 05/01/22 1100  BP: 99/70 102/78  Pulse: 79 (!) 59  Resp: 16 18  Temp:  (!) 36.4 C  SpO2: 92% 94%    Last Pain:  Vitals:   05/01/22 1100  TempSrc:   PainSc: 0-No pain                 Reed Breech

## 2022-05-01 NOTE — Op Note (Signed)
05/01/2022  9:23 AM  PATIENT:  Douglas Anthony   MRN: [id number]  PRE-OPERATIVE DIAGNOSIS:  Primary localized osteoarthritis of left knee   POST-OPERATIVE DIAGNOSIS:  Same   PROCEDURE:  Procedure(s): Left TOTAL KNEE ARTHROPLASTY   SURGEON: Leitha Schuller, MD   ASSISTANTS: Cranston Neighbor, PA-C   ANESTHESIA:   spinal   EBL: 100 cc   BLOOD ADMINISTERED:none   DRAINS:  Incisional wound VAC     LOCAL MEDICATIONS USED:  MARCAINE    and OTHER Exparel morphine   SPECIMEN:  No Specimen   DISPOSITION OF SPECIMEN:  N/A   COUNTS:  YES   TOURNIQUET: 20 minutes at 300 mm Hg   IMPLANTS: Medacta  GMK sphere system with 4+ left femur, 4 tibia with short stem and 10 mm insert.  Size 3 patella, all components cemented.   DICTATION: Reubin Milan Dictation   patient was brought to the operating room and spinal anesthesia was obtained.  After prepping and draping the left leg in sterile fashion, and after patient identification and timeout procedures were completed, tourniquet was raised  and midline skin incision was made followed by medial parapatellar arthrotomy with severe medial compartment osteoarthritis, advanced patellofemoral arthritis and mild lateral compartment arthritis, partial synovectomy was also carried out.   The ACL and PCL and fat pad were excised along with anterior horns of the meniscus. The proximal tibia cutting guide from  the [facility] system was applied and the proximal tibia cut carried out.  The distal femoral cut was carried out in a similar fashion     The 4+ femoral cutting guide applied with anterior posterior and chamfer cuts made.  The posterior horns of the menisci were removed at this point.   Injection of the above medication was carried out after the femoral and tibial cuts were carried out.  The 4 baseplate trial was placed pinned into position and proximal tibial preparation carried out with drilling hand reaming and the keel punch followed by placement of the 4+ femur  and sizing the tibial insert size 10 millimeter gave the best fit with stability and full extension.  The distal femoral drill holes were made in the notch cut for the trochlear groove was then carried out with trials were then removed the patella was cut using the patellar cutting guide and it sized to a size 3 after drill holes have been made  The knee was irrigated with pulsatile lavage and the bony surfaces dried the tibial component was cemented into place first.  Excess cement was removed and the polyethylene insert placed with a torque screw placed with a torque screwdriver tightened.  The distal femoral component was placed and the knee was held in extension as the patellar button was clamped into place.  After the cement was set, excess cement was removed and the knee was again irrigated thoroughly thoroughly irrigated.  The tourniquet was let down and hemostasis checked with electrocautery. The arthrotomy was repaired with a heavy Quill suture,  followed by 3-0 V lock subcuticular closure, skin staples followed by incisional wound VAC and Polar Care.Marland Kitchen   PLAN OF CARE: Admit for overnight observation   PATIENT DISPOSITION:  PACU - hemodynamically stable.

## 2022-05-01 NOTE — Anesthesia Preprocedure Evaluation (Addendum)
Anesthesia Evaluation  Patient identified by MRN, date of birth, ID band Patient awake    Reviewed: Allergy & Precautions, NPO status , Patient's Chart, lab work & pertinent test results  History of Anesthesia Complications (+) PONV and history of anesthetic complications  Airway Mallampati: IV   Neck ROM: Full    Dental no notable dental hx.    Pulmonary Current Smoker (6-7 cigarettes per day) and Patient abstained from smoking.,    Pulmonary exam normal breath sounds clear to auscultation       Cardiovascular hypertension, + Peripheral Vascular Disease (right leg aneurysm and DVT s/p bypass)  Normal cardiovascular exam Rhythm:Regular Rate:Normal  ECG 04/20/22:  Normal sinus rhythm Right bundle branch block Abnormal ECG When compared with ECG of 03-Jan-2021 09:02, No significant change was found   Neuro/Psych PSYCHIATRIC DISORDERS Anxiety negative neurological ROS     GI/Hepatic negative GI ROS,   Endo/Other  diabetes, Type 2  Renal/GU Renal disease (nephrolithiasis)     Musculoskeletal  (+) Arthritis ,   Abdominal   Peds  Hematology negative hematology ROS (+)   Anesthesia Other Findings   Reproductive/Obstetrics                            Anesthesia Physical Anesthesia Plan  ASA: 2  Anesthesia Plan: General and Spinal   Post-op Pain Management:    Induction: Intravenous  PONV Risk Score and Plan: 2 and Propofol infusion, TIVA, Treatment may vary due to age or medical condition and Ondansetron  Airway Management Planned: Natural Airway and Nasal Cannula  Additional Equipment:   Intra-op Plan:   Post-operative Plan:   Informed Consent: I have reviewed the patients History and Physical, chart, labs and discussed the procedure including the risks, benefits and alternatives for the proposed anesthesia with the patient or authorized representative who has indicated his/her  understanding and acceptance.       Plan Discussed with: CRNA  Anesthesia Plan Comments: (Plan for spinal and GA with natural airway, LMA/GETA backup.  Patient consented for risks of anesthesia including but not limited to:  - adverse reactions to medications - damage to eyes, teeth, lips or other oral mucosa - nerve damage due to positioning  - sore throat or hoarseness - headache, bleeding, infection, nerve damage 2/2 spinal - damage to heart, brain, nerves, lungs, other parts of body or loss of life  Informed patient about role of CRNA in peri- and intra-operative care.  Patient voiced understanding.)        Anesthesia Quick Evaluation

## 2022-05-01 NOTE — Transfer of Care (Signed)
Immediate Anesthesia Transfer of Care Note  Patient: Douglas Anthony  Procedure(s) Performed: TOTAL KNEE ARTHROPLASTY (Left: Knee)  Patient Location: PACU  Anesthesia Type:General and Spinal  Level of Consciousness: drowsy  Airway & Oxygen Therapy: Patient Spontanous Breathing and Patient connected to face mask oxygen  Post-op Assessment: Report given to RN and Post -op Vital signs reviewed and stable  Post vital signs: Reviewed and stable  Last Vitals:  Vitals Value Taken Time  BP 100/64 05/01/22 0921  Temp    Pulse 94 05/01/22 0922  Resp 21 05/01/22 0925  SpO2 96 % 05/01/22 0922  Vitals shown include unvalidated device data.  Last Pain:  Vitals:   05/01/22 0634  TempSrc: Temporal  PainSc: 2          Complications: No notable events documented.

## 2022-05-01 NOTE — Anesthesia Procedure Notes (Signed)
Spinal  Patient location during procedure: OR Start time: 05/01/2022 7:20 AM End time: 05/01/2022 7:29 AM Reason for block: surgical anesthesia Staffing Performed: resident/CRNA  Resident/CRNA: Lia Foyer, CRNA Preanesthetic Checklist Completed: patient identified, IV checked, site marked, risks and benefits discussed, surgical consent, monitors and equipment checked, pre-op evaluation and timeout performed Spinal Block Patient position: sitting Prep: DuraPrep Patient monitoring: heart rate, cardiac monitor, continuous pulse ox and blood pressure Approach: midline Location: L3-4 Injection technique: single-shot Needle Needle type: Pencan  Needle gauge: 24 G Needle length: 9 cm Assessment Sensory level: T4 Events: CSF return

## 2022-05-01 NOTE — Plan of Care (Signed)
  Problem: Education: Goal: Knowledge of the prescribed therapeutic regimen will improve Outcome: Progressing   Problem: Activity: Goal: Ability to avoid complications of mobility impairment will improve Outcome: Progressing Goal: Range of joint motion will improve Outcome: Progressing   Problem: Skin Integrity: Goal: Will show signs of wound healing Outcome: Progressing

## 2022-05-02 ENCOUNTER — Other Ambulatory Visit: Payer: Self-pay

## 2022-05-02 DIAGNOSIS — M1712 Unilateral primary osteoarthritis, left knee: Secondary | ICD-10-CM | POA: Diagnosis not present

## 2022-05-02 LAB — CBC
HCT: 35.6 % — ABNORMAL LOW (ref 39.0–52.0)
Hemoglobin: 12.2 g/dL — ABNORMAL LOW (ref 13.0–17.0)
MCH: 31.9 pg (ref 26.0–34.0)
MCHC: 34.3 g/dL (ref 30.0–36.0)
MCV: 93.2 fL (ref 80.0–100.0)
Platelets: 222 10*3/uL (ref 150–400)
RBC: 3.82 MIL/uL — ABNORMAL LOW (ref 4.22–5.81)
RDW: 11.8 % (ref 11.5–15.5)
WBC: 9.8 10*3/uL (ref 4.0–10.5)
nRBC: 0 % (ref 0.0–0.2)

## 2022-05-02 LAB — BASIC METABOLIC PANEL
Anion gap: 8 (ref 5–15)
BUN: 11 mg/dL (ref 8–23)
CO2: 25 mmol/L (ref 22–32)
Calcium: 8.2 mg/dL — ABNORMAL LOW (ref 8.9–10.3)
Chloride: 101 mmol/L (ref 98–111)
Creatinine, Ser: 0.88 mg/dL (ref 0.61–1.24)
GFR, Estimated: >60 mL/min (ref >60)
Glucose, Bld: 114 mg/dL — ABNORMAL HIGH (ref 70–99)
Potassium: 4.3 mmol/L (ref 3.5–5.1)
Sodium: 134 mmol/L — ABNORMAL LOW (ref 135–145)

## 2022-05-02 LAB — GLUCOSE, CAPILLARY: Glucose-Capillary: 126 mg/dL — ABNORMAL HIGH (ref 70–99)

## 2022-05-02 MED ORDER — DOCUSATE SODIUM 100 MG PO CAPS: 100.0000 mg | ORAL_CAPSULE | Freq: Two times a day (BID) | ORAL | 0 refills | Status: AC

## 2022-05-02 MED ORDER — POLYETHYLENE GLYCOL 3350 17 G PO PACK: 17.0000 g | PACK | Freq: Every day | ORAL | 0 refills | Status: AC | PRN

## 2022-05-02 MED ORDER — AMLODIPINE BESYLATE 10 MG PO TABS: 10.0000 mg | ORAL_TABLET | Freq: Every day | ORAL | Status: DC

## 2022-05-02 MED ORDER — METHOCARBAMOL 500 MG PO TABS
500.0000 mg | ORAL_TABLET | Freq: Four times a day (QID) | ORAL | 0 refills | Status: AC | PRN
  Filled 2022-05-02: qty 30, 8d supply, fill #0

## 2022-05-02 MED ORDER — OXYCODONE HCL 5 MG PO TABS
5.0000 mg | ORAL_TABLET | ORAL | 0 refills | Status: AC | PRN
Start: 2022-05-02 — End: ?
  Filled 2022-05-02: qty 30, 3d supply, fill #0

## 2022-05-02 MED ORDER — LORAZEPAM 1 MG PO TABS
0.5000 mg | ORAL_TABLET | Freq: Two times a day (BID) | ORAL | 3 refills | Status: AC
  Filled 2022-05-02: qty 30, 30d supply, fill #0
  Filled 2022-06-11: qty 30, 30d supply, fill #1
  Filled 2022-07-11: qty 30, 30d supply, fill #2
  Filled 2022-08-14: qty 30, 30d supply, fill #3

## 2022-05-02 MED ORDER — ENOXAPARIN SODIUM 40 MG/0.4ML IJ SOSY
40.0000 mg | PREFILLED_SYRINGE | INTRAMUSCULAR | 0 refills | Status: AC
  Filled 2022-05-02: qty 5.6, 14d supply, fill #0

## 2022-05-02 MED ORDER — LISINOPRIL 20 MG PO TABS: 40.0000 mg | ORAL_TABLET | Freq: Every day | ORAL | Status: DC

## 2022-05-02 MED ORDER — TRAMADOL HCL 50 MG PO TABS
50.0000 mg | ORAL_TABLET | Freq: Four times a day (QID) | ORAL | 0 refills | Status: AC | PRN
  Filled 2022-05-02: qty 30, 8d supply, fill #0

## 2022-05-02 NOTE — Progress Notes (Signed)
OT Cancellation Note  Patient Details Name: Douglas Anthony MRN: [id number] DOB: 01/14/1961   Cancelled Treatment:    Reason Eval/Treat Not Completed: Other (comment) (Pt discharging)  Spoke with pt and all AE needs met.  Pt ready to leave for home with spouse.  Nancy Stoddard, MS, OTR/L  Nancy K Stoddard 05/02/2022, 11:11 AM 

## 2022-05-02 NOTE — Progress Notes (Signed)
Pt being discharged, IV taken out, portable wound vac attached and explained to patient.  AVS packet given to patient and explained, pt understands instructions.  Pt to be discharged to medical mall entrance to go home with wife.

## 2022-05-02 NOTE — Progress Notes (Addendum)
   Subjective: 1 Day Post-Op Procedure(s) (LRB): TOTAL KNEE ARTHROPLASTY (Left) Patient reports pain as mild.   Patient is well, and has had no acute complaints or problems Denies any CP, SOB, ABD pain. We will continue therapy today.  Plan is to go Home after hospital stay.  Objective: Vital signs in last 24 hours: Temp:  [97.5 F (36.4 C)-97.8 F (36.6 C)] 97.7 F (36.5 C) (05/24 0359) Pulse Rate:  [57-96] 75 (05/24 0359) Resp:  [12-27] 18 (05/24 0359) BP: (86-135)/(60-92) 106/80 (05/24 0359) SpO2:  [92 %-96 %] 96 % (05/24 0359)  Intake/Output from previous day: 05/23 0701 - 05/24 0700 In: 3097.2 [P.O.:720; I.V.:2277.2; IV Piggyback:100] Out: 2250 [Urine:2225; Blood:25] Intake/Output this shift: No intake/output data recorded.  Recent Labs    05/01/22 1137 05/02/22 0406  HGB 12.9* 12.2*   Recent Labs    05/01/22 1137 05/02/22 0406  WBC 7.8 9.8  RBC 4.03* 3.82*  HCT 37.1* 35.6*  PLT 211 222   Recent Labs    05/01/22 1137 05/02/22 0406  NA  --  134*  K  --  4.3  CL  --  101  CO2  --  25  BUN  --  11  CREATININE 0.97 0.88  GLUCOSE  --  114*  CALCIUM  --  8.2*   No results for input(s): LABPT, INR in the last 72 hours.  EXAM General - Patient is Alert, Appropriate, and Oriented Extremity - Neurovascular intact Sensation intact distally Intact pulses distally Dorsiflexion/Plantar flexion intact Dressing - dressing C/D/I and no drainage, Praveena intact without drainage Motor Function - intact, moving foot and toes well on exam.   Past Medical History:  Diagnosis Date   Aneurysm of leg, right (HCC)    Anxiety    Arthritis    Blood clot in vein    left leg; Tx Oral medication   Diabetes mellitus without complication (HCC)    Hyperlipemia    Hypertension    PONV (postoperative nausea and vomiting)     Assessment/Plan:   1 Day Post-Op Procedure(s) (LRB): TOTAL KNEE ARTHROPLASTY (Left) Principal Problem:   S/P TKR (total knee replacement)  using cement, left  Estimated body mass index is 30.6 kg/m as calculated from the following:   Height as of this encounter: 5\' 9"  (1.753 m).   Weight as of this encounter: 94 kg. Advance diet Up with therapy, excellent progress of physical therapy yesterday. Pain well controlled Labs stable Vital signs stable, blood pressure soft.  Hold lisinopril and amlodipine this morning Care management to assist with discharge to home with home health PT today.  DVT Prophylaxis - Lovenox, TED hose, and SCDs Weight-Bearing as tolerated to left leg   T. , PA-C [facility] 05/02/2022, 7:20 AM

## 2022-05-02 NOTE — Plan of Care (Signed)

## 2022-05-02 NOTE — Progress Notes (Signed)
Physical Therapy Treatment Patient Details Name: Douglas Anthony MRN: [id number] DOB: 12/21/1960 Today's Date: 05/02/2022   History of Present Illness Patient is a 61 year old male s/p L total knee arthroplasty    PT Comments    Pt was A and O x 4. Agrees to session and is motivated throughout. Easily and safely able to exit bed, stand, and ambulate with RW. O safety concerns. Pt is cleared from an acute PT standpoint for safe DC home with HHPT to follow.  Recommendations for follow up therapy are one component of a multi-disciplinary discharge planning process, led by the attending physician.  Recommendations may be updated based on patient status, additional functional criteria and insurance authorization.  Follow Up Recommendations  Home health PT     Assistance Recommended at Discharge PRN  Patient can return home with the following A little help with walking and/or transfers;Help with stairs or ramp for entrance;A little help with bathing/dressing/bathroom   Equipment Recommendations  None recommended by PT       Precautions / Restrictions Precautions Precautions: Fall Restrictions Weight Bearing Restrictions: Yes LLE Weight Bearing: Weight bearing as tolerated     Mobility  Bed Mobility Overal bed mobility: Needs Assistance Bed Mobility: Supine to Sit, Sit to Supine     Supine to sit: Supervision, HOB elevated Sit to supine: Supervision, HOB elevated        Transfers Overall transfer level: Needs assistance Equipment used: Rolling walker (2 wheels) Transfers: Sit to/from Stand Sit to Stand: Supervision                Ambulation/Gait Ambulation/Gait assistance: Supervision Gait Distance (Feet): 150 Feet Assistive device: Rolling walker (2 wheels) Gait Pattern/deviations: Step-through pattern, Decreased step length - right, Decreased step length - left, Decreased stride length, Narrow base of support, Antalgic       General Gait Details: no LOB or  safety concerns with ambulation     Balance Overall balance assessment: Modified Independent      Cognition Arousal/Alertness: Awake/alert Behavior During Therapy: WFL for tasks assessed/performed Overall Cognitive Status: Within Functional Limits for tasks assessed    General Comments: Pt is A and O x 4 and able to follow commands consistently. Is very HOH.           General Comments General comments (skin integrity, edema, etc.): reviewed importance of ther ex, stretching, and frequent mobility. He states understanding. Issued HEP handout and pt demonstrates abilities to I'ly perform      Pertinent Vitals/Pain Pain Assessment Pain Assessment: 0-10 Pain Score: 6  Pain Location: knee Pain Descriptors / Indicators: Discomfort Pain Intervention(s): Limited activity within patient's tolerance, Monitored during session, Premedicated before session, Repositioned, Ice applied     PT Goals (current goals can now be found in the care plan section) Acute Rehab PT Goals Patient Stated Goal: home Progress towards PT goals: Progressing toward goals    Frequency    BID      PT Plan Current plan remains appropriate       AM-PAC PT "6 Clicks" Mobility   Outcome Measure  Help needed turning from your back to your side while in a flat bed without using bedrails?: A Little Help needed moving from lying on your back to sitting on the side of a flat bed without using bedrails?: A Little Help needed moving to and from a bed to a chair (including a wheelchair)?: A Little Help needed standing up from a chair using your arms (e.g.,  wheelchair or bedside chair)?: A Little Help needed to walk in hospital room?: A Little Help needed climbing 3-5 steps with a railing? : A Little 6 Click Score: 18    End of Session   Activity Tolerance: Patient tolerated treatment well Patient left: in bed;with call bell/phone within reach;with nursing/sitter in room;with family/visitor present Nurse  Communication: Mobility status PT Visit Diagnosis: Unsteadiness on feet (R26.81);Muscle weakness (generalized) (M62.81);Pain Pain - Right/Left: Left Pain - part of body: Knee     Time: [id number] PT Time Calculation (min) (ACUTE ONLY): 23 min  Charges:  $Gait Training: 8-22 mins $Therapeutic Exercise: 8-22 mins                     Julaine Fusi PTA 05/02/22, 9:16 AM

## 2022-05-02 NOTE — Discharge Instructions (Signed)

## 2022-05-02 NOTE — Discharge Summary (Addendum)
Physician Discharge Summary  Patient ID: Douglas Anthony Timko MRN: [id number] DOB/AGE: 62/01/1961 62 y.o.  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 male who was admitted 05/01/2022 with Anthony diagnosis of S/P TKR (total knee replacement) using cement, left and went to the operating room on 05/01/2022 and underwent the above named procedures.    Surgeries: Procedure(s): TOTAL KNEE ARTHROPLASTY on 05/01/2022 Patient tolerated the surgery well. Taken to PACU where she was stabilized and then transferred to the orthopedic floor.  Started on Lovenox 30 mg q 12 hrs. Foot pumps applied bilaterally at 80 mm. Heels elevated on bed with rolled towels. No evidence of DVT. Negative Homan. Physical therapy started on day #1 for gait training and transfer. OT started day #1 for ADL and assisted devices.  Patient's foley was d/c on day #1. Patient's IV was d/c on day #1.  On post op day #1patient was stable and ready for discharge to home with home health PT.    He was given perioperative antibiotics:  Anti-infectives (From admission, onward)    Start      Dose/Rate Route Frequency Ordered Stop   05/01/22 1300  ceFAZolin (ANCEF) IVPB 2g/100 mL premix        2 g 200 mL/hr over 30 Minutes Intravenous Every 6 hours 05/01/22 1120 05/01/22 2150   05/01/22 0630  ceFAZolin (ANCEF) 2-4 GM/100ML-% IVPB       Note to Pharmacy: Christene SlatesHopkins, Erika W: cabinet override      05/01/22 0630 05/01/22 2200   05/01/22 0600  ceFAZolin (ANCEF) IVPB 2g/100 mL premix        2 g 200 mL/hr over 30 Minutes Intravenous On call to O.R. 04/30/22 2310 05/01/22 0735     .  He was given sequential compression devices, early ambulation, and Lovenox, teds for DVT prophylaxis.  He benefited maximally from the hospital stay and there were no complications.    Recent vital signs:  Vitals:   05/01/22 2335 05/02/22 0359  BP: 117/85 106/80  Pulse: 86 75  Resp: 16 18  Temp: 97.7 F (36.5 C) 97.7 F (36.5 C)  SpO2: 96% 96%    Recent laboratory studies:  Lab Results  Component Value Date   HGB 12.2 (L) 05/02/2022   HGB 12.9 (L) 05/01/2022   HGB 15.1 04/20/2022   Lab Results  Component Value Date   WBC 9.8 05/02/2022   PLT 222 05/02/2022   Lab Results  Component Value Date   INR 1.04 11/11/2015   Lab Results  Component Value Date   NA 134 (L) 05/02/2022  K 4.3 05/02/2022   CL 101 05/02/2022   CO2 25 05/02/2022   BUN 11 05/02/2022   CREATININE 0.88 05/02/2022   GLUCOSE 114 (H) 05/02/2022    Discharge Medications:   Allergies as of 05/02/2022   No Known Allergies      Medication List     STOP taking these medications    quinapril 40 MG tablet Commonly known as: ACCUPRIL       TAKE these medications    acetaminophen 500 MG tablet Commonly known as: TYLENOL Take 500 mg by mouth in the morning and at bedtime.   amLODipine 10 MG tablet Commonly known as: NORVASC TAKE 1 TABLET BY MOUTH ONCE DAILY   aspirin EC 81 MG tablet Take 81 mg by mouth daily. Swallow whole.   atorvastatin 20 MG tablet Commonly known as: LIPITOR TAKE 1 TABLET BY  MOUTH DAILY EVERY EVENING   Biofreeze 4 % Gel Generic drug: Menthol (Topical Analgesic) Apply 1 application. topically daily as needed (knee pain).   cephALEXin 500 MG capsule Commonly known as: KEFLEX Take 4 pills one hour prior to dental appointment.   docusate sodium 100 MG capsule Commonly known as: COLACE Take 1 capsule (100 mg total) by mouth 2 (two) times daily.   enoxaparin 40 MG/0.4ML injection Commonly known as: LOVENOX Inject 0.4 mLs (40 mg total) into the skin daily for 14 days.   levocetirizine 5 MG tablet Commonly known as: XYZAL TAKE 1 TABLET BY MOUTH ONCE DAILY   lisinopril 40 MG tablet Commonly known as: ZESTRIL Take 1 tablet by mouth once daily   LORazepam 1 MG tablet Commonly known as: ATIVAN Take 1/2 tablet (0.5 mg total) by mouth 2 (two) times daily.   magnesium oxide 400 MG tablet Commonly known as: MAG-OX Take 400 mg by mouth daily.   metFORMIN 500 MG tablet Commonly known as: GLUCOPHAGE TAKE 2 TABLETS BY MOUTH 2 TIMES Anthony DAY   methocarbamol 500 MG tablet Commonly known as: ROBAXIN Take 1 tablet (500 mg total) by mouth every 6 (six) hours as needed for muscle spasms.   neomycin-polymyxin-hydrocortisone 3.5-10000-1 OTIC suspension Commonly known as: CORTISPORIN Use 4 drops to affected ear twice Anthony day for 10 days. (Apply 4 drops to affected ear BID for 10 days, may use eye drops if less expensive)   oxyCODONE 5 MG immediate release tablet Commonly known as: Oxy IR/ROXICODONE Take 1-2 tablets (5-10 mg total) by mouth every 4 (four) hours as needed for moderate pain (pain score 4-6).   polyethylene glycol 17 g packet Commonly known as: MIRALAX / GLYCOLAX Take 17 g by mouth daily as needed for mild constipation.   traMADol 50 MG tablet Commonly known as: ULTRAM Take 1 tablet (50 mg total) by mouth every 6 (six) hours as needed.   Turmeric 500 MG Caps Take 500 mg by mouth in the morning and at bedtime.   zinc gluconate 50 MG tablet Take  50 mg by mouth daily. Patient is unsure of dose. This is an educated guess by this RN        Diagnostic Studies: DG Knee 1-2 Views Left  Result Date: 05/01/2022 CLINICAL DATA:  Total knee replacement EXAM: LEFT KNEE - 1-2 VIEW COMPARISON:  CT left knee 03/22/2022 FINDINGS: There is Anthony 3 component total knee arthroplasty in normal alignment without evidence of loosening or periprosthetic fracture. Expected soft tissue changes. IMPRESSION: Left total knee arthroplasty. No evidence of immediate hardware complication. Electronically Signed   By: Erma Heritage.D.  On: 05/01/2022 10:04    Disposition:      Follow-up Information     Evon Slack, PA-C Follow up in 2 week(s).   Specialties: Orthopedic Surgery, Emergency Medicine Contact information: [address] [place] [address] [phone]                  Signed: Patience Musca 05/02/2022, 7:26 AM

## 2022-05-03 DIAGNOSIS — M1712 Unilateral primary osteoarthritis, left knee: Secondary | ICD-10-CM | POA: Diagnosis not present

## 2022-05-05 DIAGNOSIS — M1712 Unilateral primary osteoarthritis, left knee: Secondary | ICD-10-CM | POA: Diagnosis not present

## 2022-05-07 DIAGNOSIS — M1712 Unilateral primary osteoarthritis, left knee: Secondary | ICD-10-CM | POA: Diagnosis not present

## 2022-05-08 ENCOUNTER — Encounter: Payer: Self-pay | Admitting: Orthopedic Surgery

## 2022-05-09 DIAGNOSIS — M1712 Unilateral primary osteoarthritis, left knee: Secondary | ICD-10-CM | POA: Diagnosis not present

## 2022-05-12 DIAGNOSIS — M1712 Unilateral primary osteoarthritis, left knee: Secondary | ICD-10-CM | POA: Diagnosis not present

## 2022-05-14 DIAGNOSIS — M1712 Unilateral primary osteoarthritis, left knee: Secondary | ICD-10-CM | POA: Diagnosis not present

## 2022-05-16 DIAGNOSIS — Z96652 Presence of left artificial knee joint: Secondary | ICD-10-CM | POA: Diagnosis not present

## 2022-05-21 DIAGNOSIS — Z96652 Presence of left artificial knee joint: Secondary | ICD-10-CM | POA: Diagnosis not present

## 2022-05-21 IMAGING — CT CT KNEE*L* W/O CM
4 of 8 series · 12 of 33 positions shown, 13 images · non-contrast
Comparison: None available

CLINICAL DATA: Preoperative evaluation.  Chronic right knee pain.

EXAM:
CT OF THE LEFT KNEE WITHOUT CONTRAST
TECHNIQUE: Multidetector CT imaging of the left knee was performed according to
the standard protocol. Multiplanar CT image reconstructions were
also generated.
RADIATION DOSE REDUCTION: This exam was performed according to the
departmental dose-optimization program which includes automated
exposure control, adjustment of the mA and/or kV according to
patient size and/or use of iterative reconstruction technique.

[Series 6: axial bone · axial · 0.39mm/px · z∈[+643,+793]mm · 3 of 300 slices shown, 4 images]
[im 75/300  soft-tissue]
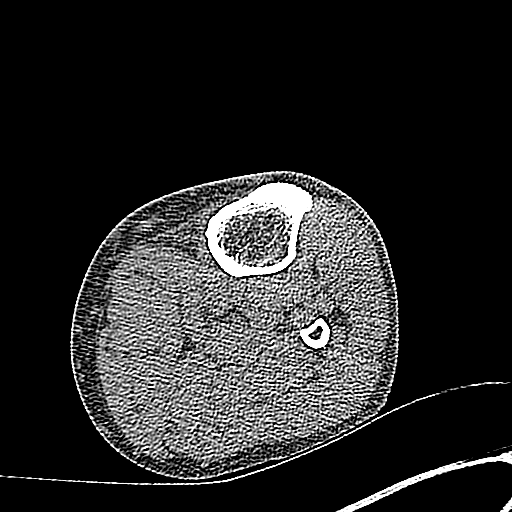
[im 75/300  bone]
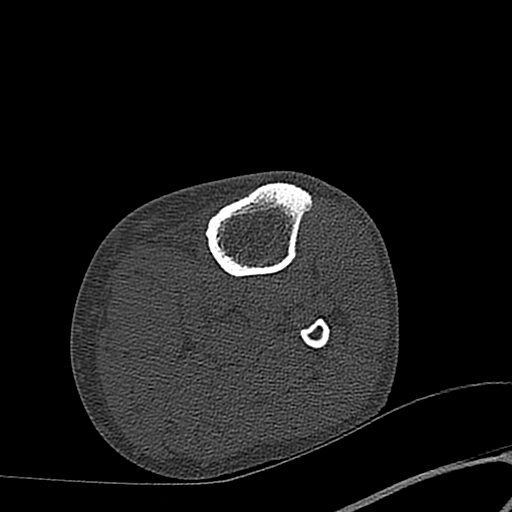
[im 150/300  bone]
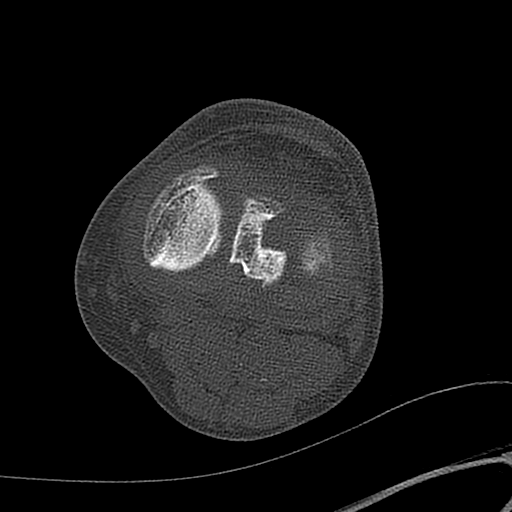
[im 225/300  bone]
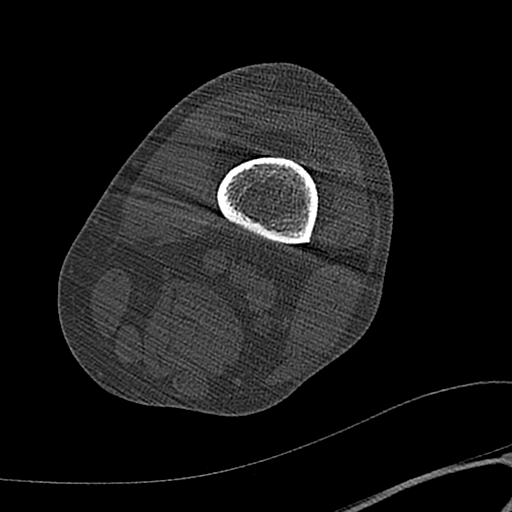

[Series 9: axial st · axial · 0.39mm/px · z∈[+643,+793]mm · 3 of 300 slices shown]
[im 75/300  bone]
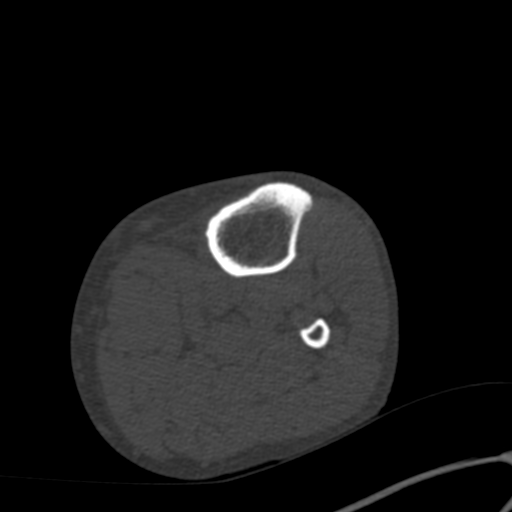
[im 150/300  bone]
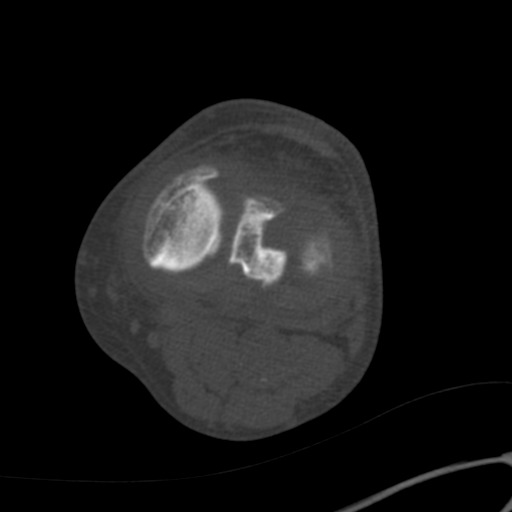
[im 225/300  bone]
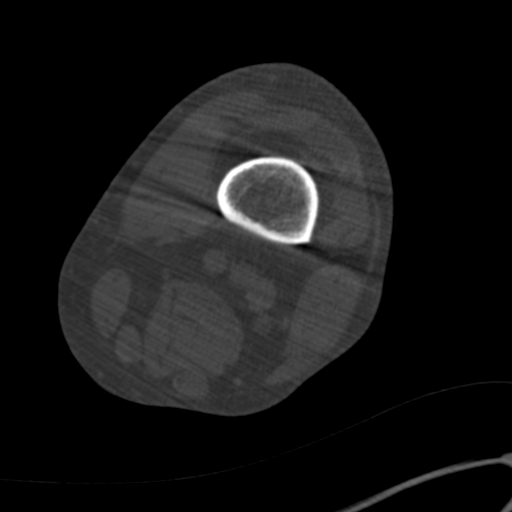

[Series 10: coronal bone · coronal · 0.25mm/px · 1 of 84 slices shown]
[im 42/84  bone]
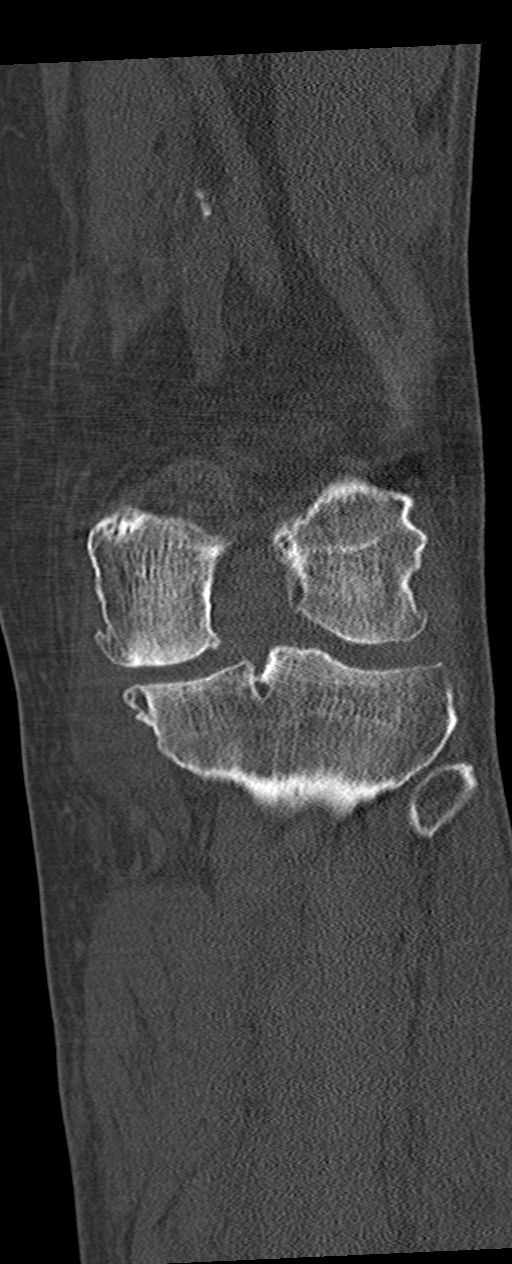

[Series 12: sagittal bone · sagittal · 0.32mm/px · 5 of 65 slices shown]
[im 11/65  bone]
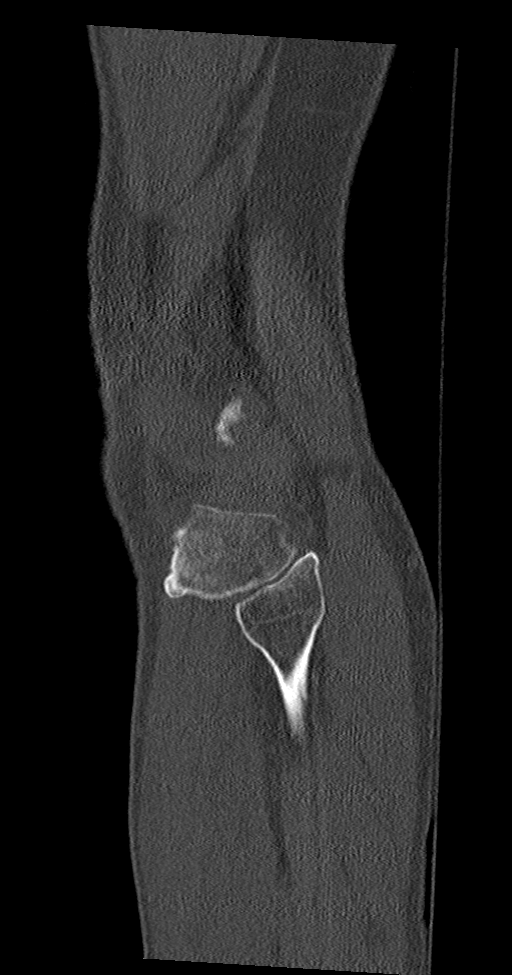
[im 22/65  bone]
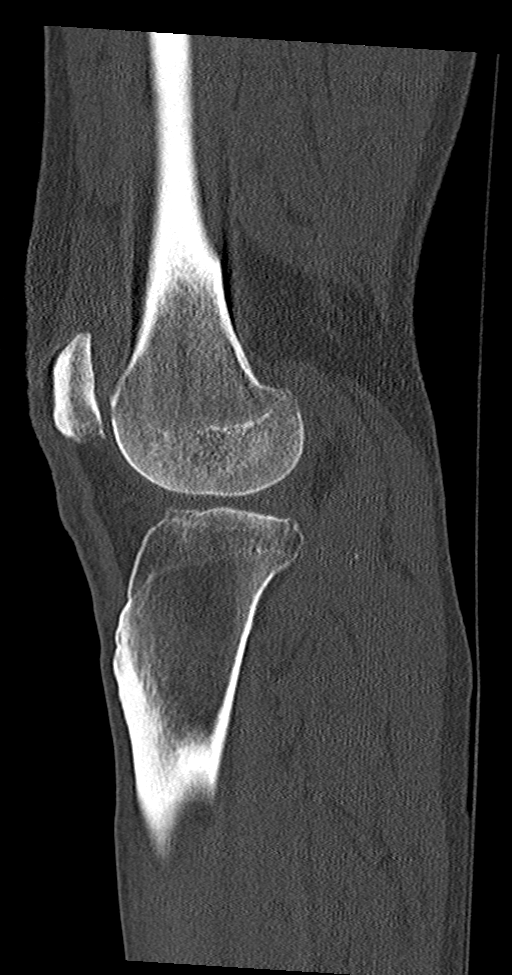
[im 33/65  bone]
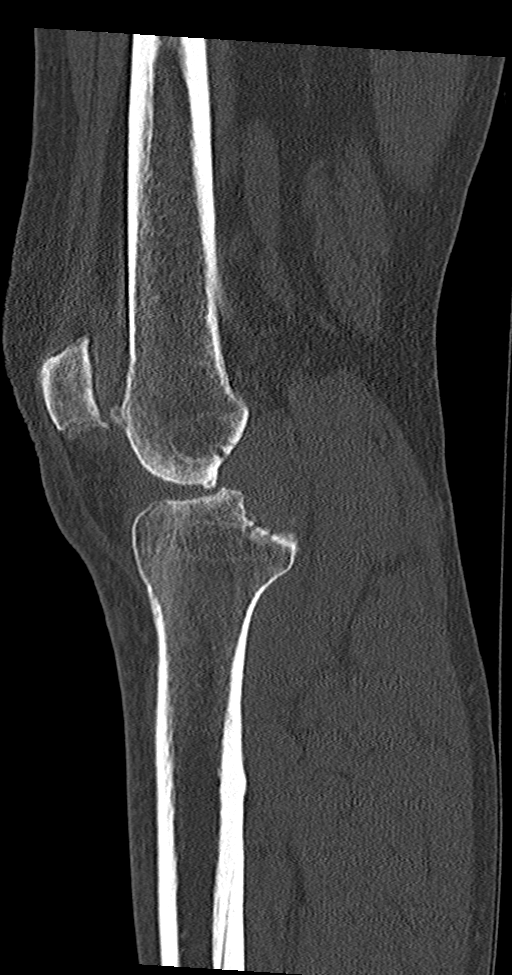
[im 43/65  bone]
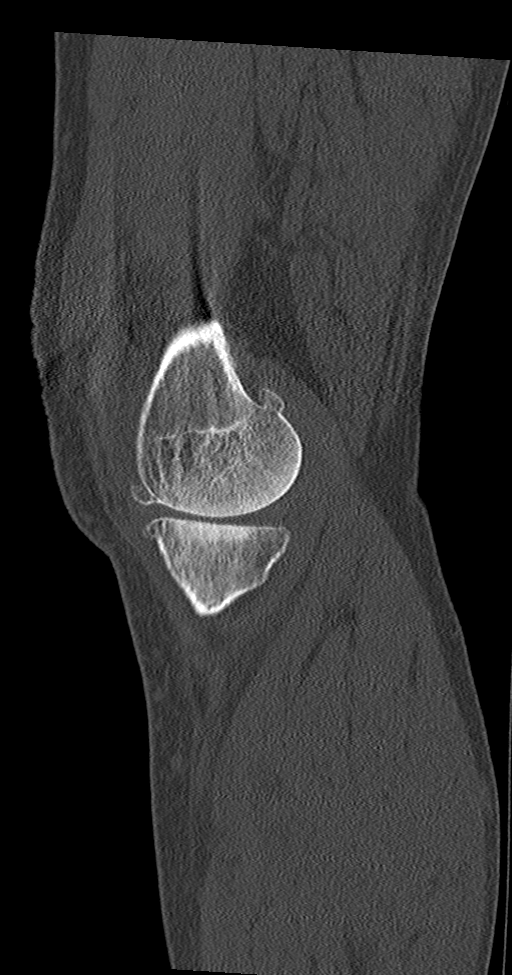
[im 54/65  bone]
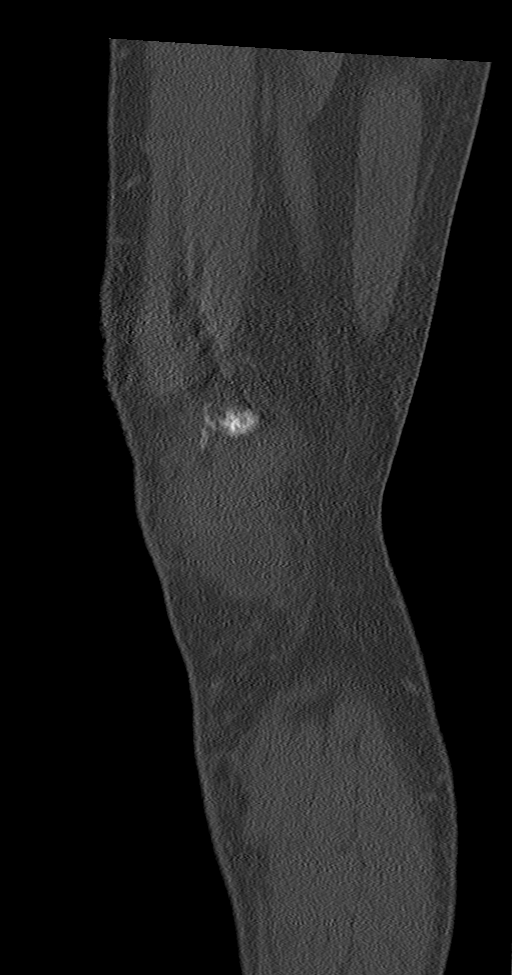

[12 of 33 positions shown; findings below may reference images not displayed]

FINDINGS: Bones/Joint/Cartilage

There is a degenerative subchondral cyst at the anterior
superolateral left femoral head-neck junction measuring up to 11 mm.
Mild chronic enthesopathic change at the gluteus minimus insertion
on the greater trochanter. At least mild left femoroacetabular joint
space narrowing.

Moderate patellofemoral joint space narrowing and peripheral
osteophytosis. Severe medial compartment joint space narrowing,
subchondral sclerosis, and peripheral osteophytosis. Mild peripheral
lateral compartment degenerative osteophytosis. Tiny knee joint
effusion. No fracture or dislocation.

Left ankle:

No acute fracture or dislocation. No lytic or blastic osseous
lesion.

Ligaments

Suboptimally assessed by CT.

Muscles and Tendons

Grossly intact.  No muscle atrophy.

Soft tissues

There is moderate peripheral calcification within the popliteal
artery in the region of mild dilatation measuring up to 1.7 cm in
caliber (axial series 9, image 156 and sagittal series 13, image
40). The more proximal normal caliber of the artery measures
approximately 10 mm.
IMPRESSION: 1. Severe medial compartment and moderate patellofemoral compartment
osteoarthritis of the knee.
2. Mild aneurysmal dilatation of the popliteal artery, measuring up
to 1.7 cm at the level of the tibiotalar joint.

## 2022-05-24 ENCOUNTER — Other Ambulatory Visit: Payer: Self-pay

## 2022-05-24 DIAGNOSIS — Z96652 Presence of left artificial knee joint: Secondary | ICD-10-CM | POA: Diagnosis not present

## 2022-05-24 MED ORDER — OXYCODONE HCL 5 MG PO TABS
ORAL_TABLET | ORAL | 0 refills | Status: AC
  Filled 2022-05-24: qty 30, 5d supply, fill #0

## 2022-05-24 MED ORDER — METHOCARBAMOL 500 MG PO TABS
ORAL_TABLET | ORAL | 0 refills | Status: AC
  Filled 2022-05-24: qty 60, 15d supply, fill #0

## 2022-05-24 MED ORDER — TRAMADOL HCL 50 MG PO TABS
ORAL_TABLET | ORAL | 0 refills | Status: AC
  Filled 2022-05-24: qty 30, 8d supply, fill #0

## 2022-05-25 ENCOUNTER — Other Ambulatory Visit: Payer: Self-pay | Admitting: Orthopedic Surgery

## 2022-05-25 ENCOUNTER — Ambulatory Visit
Admission: RE | Admit: 2022-05-25 | Discharge: 2022-05-25 | Disposition: A | Discharge status: Home / Self Care | Payer: 59 | Source: Ambulatory Visit | Attending: Orthopedic Surgery | Admitting: Orthopedic Surgery

## 2022-05-25 DIAGNOSIS — Z96652 Presence of left artificial knee joint: Secondary | ICD-10-CM

## 2022-05-25 DIAGNOSIS — M79662 Pain in left lower leg: Secondary | ICD-10-CM

## 2022-05-25 DIAGNOSIS — I82432 Acute embolism and thrombosis of left popliteal vein: Secondary | ICD-10-CM | POA: Diagnosis not present

## 2022-05-28 DIAGNOSIS — Z96652 Presence of left artificial knee joint: Secondary | ICD-10-CM | POA: Diagnosis not present

## 2022-05-31 DIAGNOSIS — Z96652 Presence of left artificial knee joint: Secondary | ICD-10-CM | POA: Diagnosis not present

## 2022-06-04 DIAGNOSIS — Z96652 Presence of left artificial knee joint: Secondary | ICD-10-CM | POA: Diagnosis not present

## 2022-06-07 DIAGNOSIS — Z96652 Presence of left artificial knee joint: Secondary | ICD-10-CM | POA: Diagnosis not present

## 2022-06-11 ENCOUNTER — Other Ambulatory Visit: Payer: Self-pay

## 2022-06-11 DIAGNOSIS — Z96652 Presence of left artificial knee joint: Secondary | ICD-10-CM | POA: Diagnosis not present

## 2022-06-13 DIAGNOSIS — Z96652 Presence of left artificial knee joint: Secondary | ICD-10-CM | POA: Diagnosis not present

## 2022-06-13 DIAGNOSIS — M1712 Unilateral primary osteoarthritis, left knee: Secondary | ICD-10-CM | POA: Diagnosis not present

## 2022-06-26 ENCOUNTER — Other Ambulatory Visit: Payer: Self-pay

## 2022-07-11 ENCOUNTER — Other Ambulatory Visit: Payer: Self-pay

## 2022-07-24 IMAGING — US US EXTREM LOW VENOUS*L*
1 series · 13 of 24 positions shown · non-contrast
Comparison: None Available.

CLINICAL DATA: Left calf pain



[Series 1: us venous img lower uni left (dvt) · portal-venous · 13 of 32 slices shown]
[im 1/32]
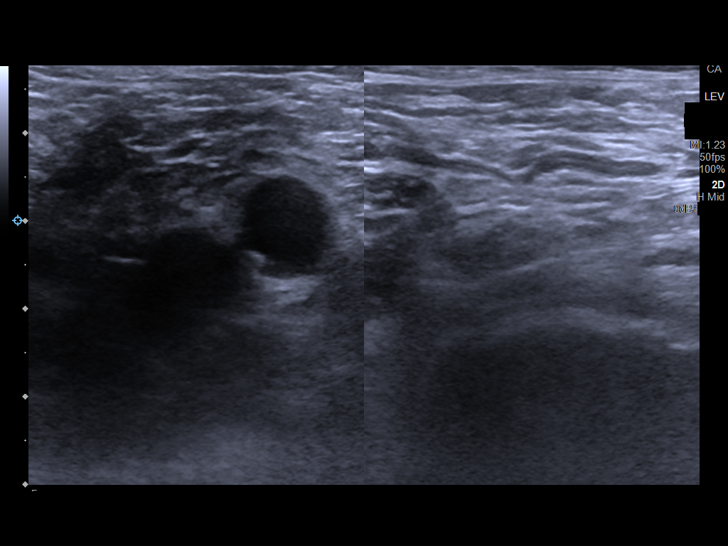
[im 3/32]
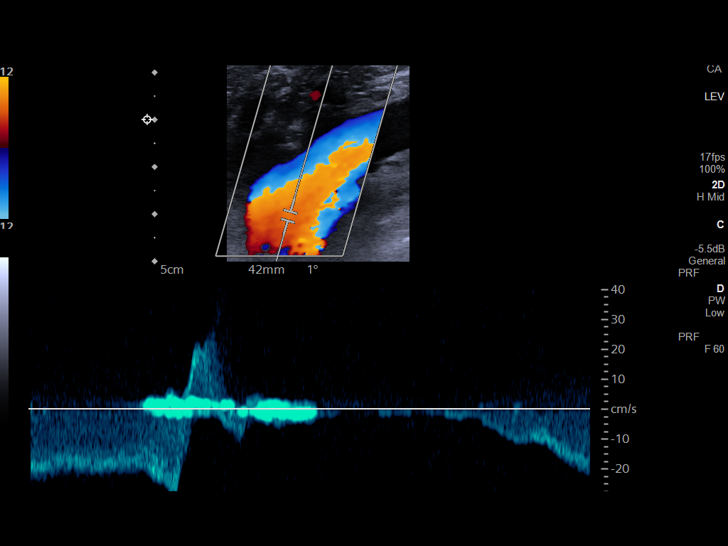
[im 6/32]
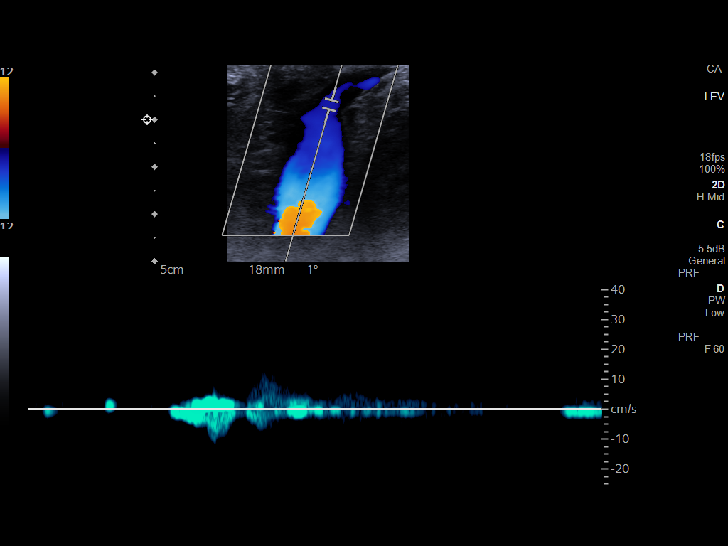
[im 9/32]
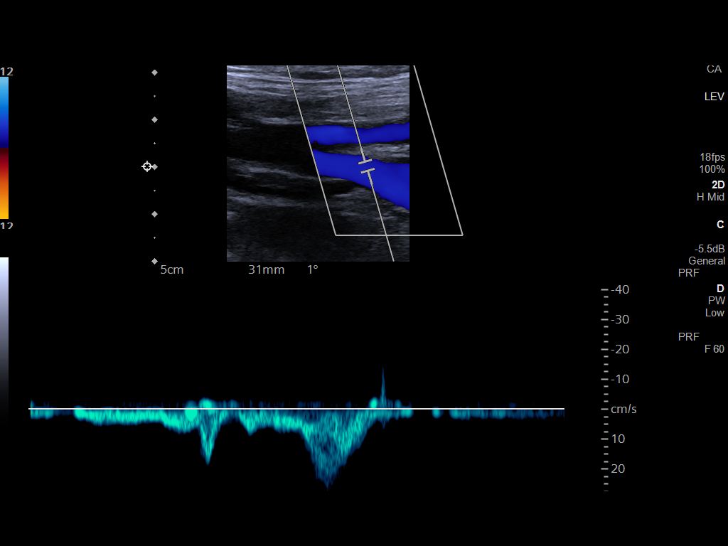
[im 11/32]
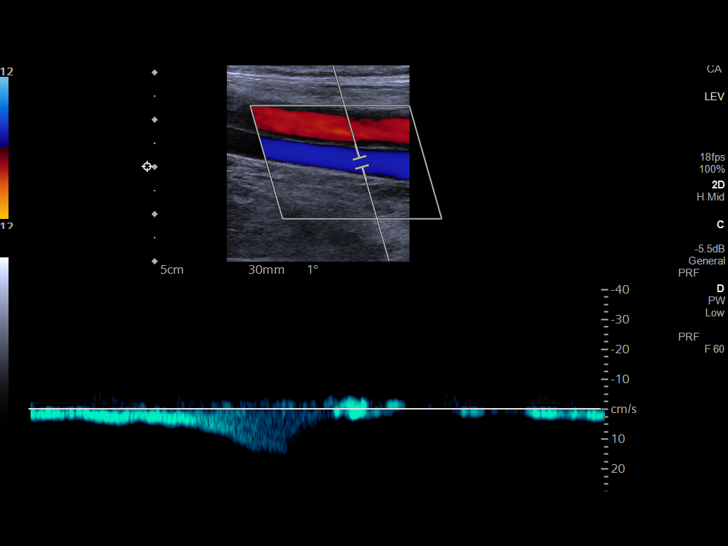
[im 14/32]
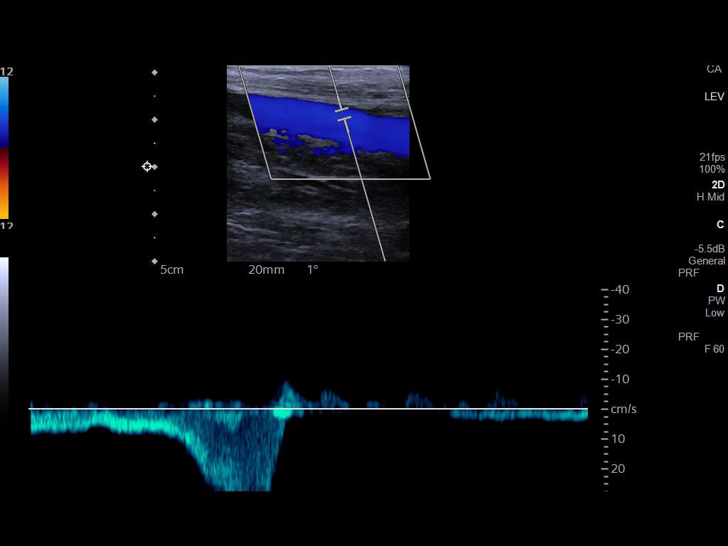
[im 17/32]
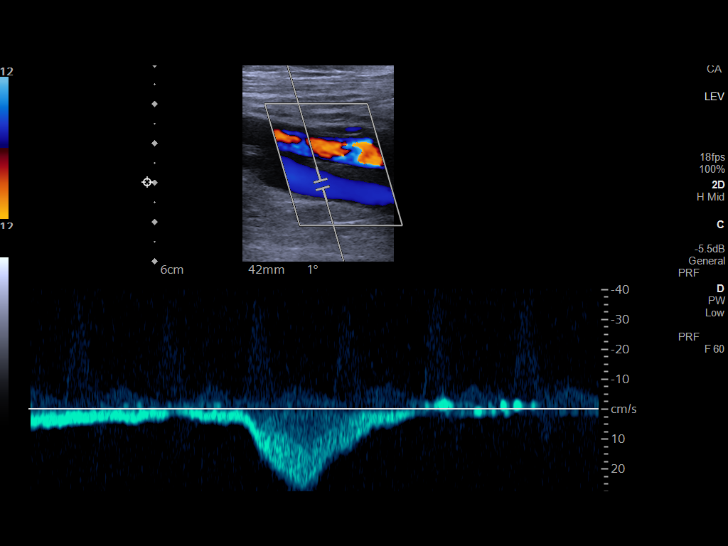
[im 18/32]
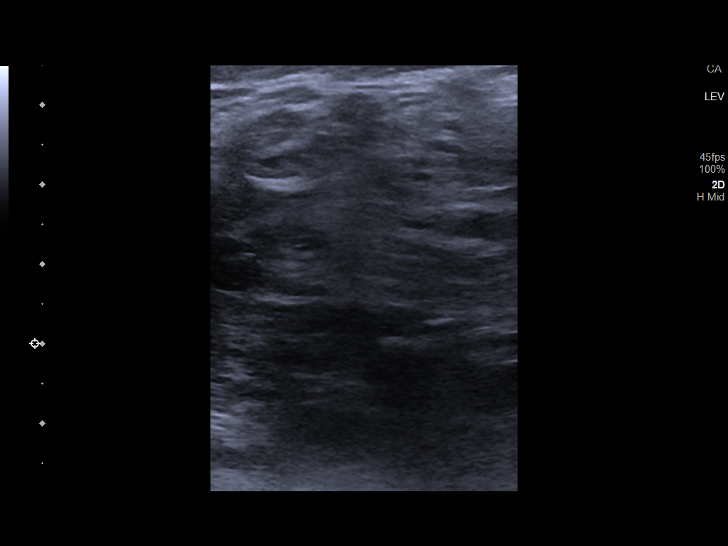
[im 21/32]
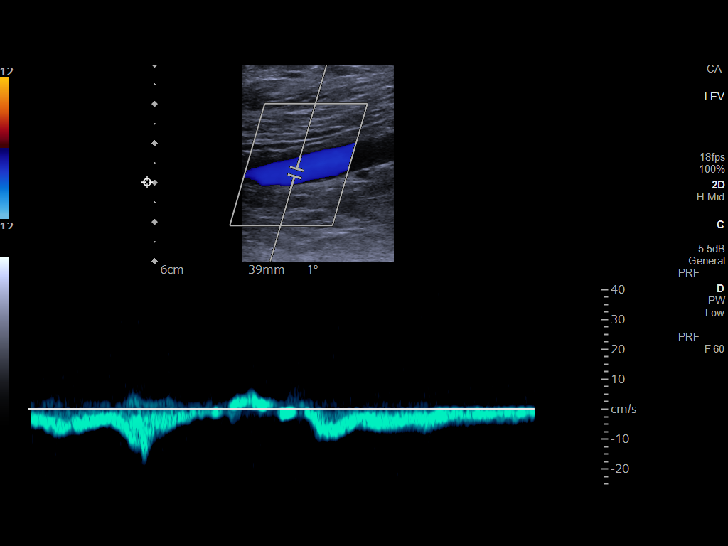
[im 23/32]
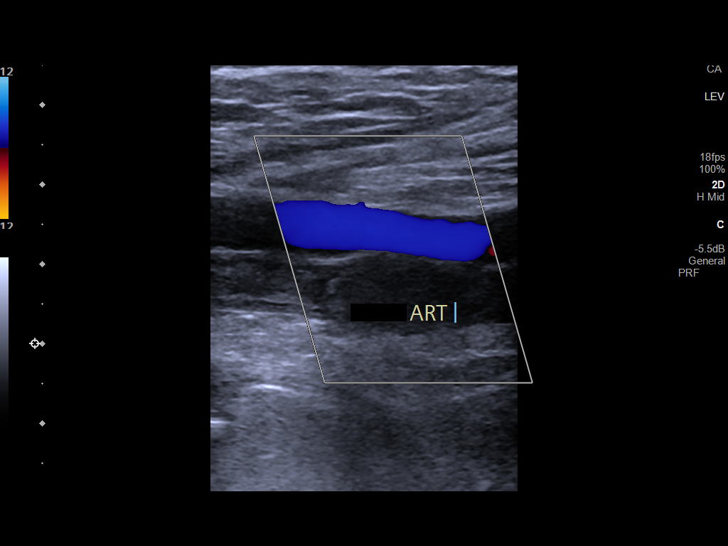
[im 26/32]
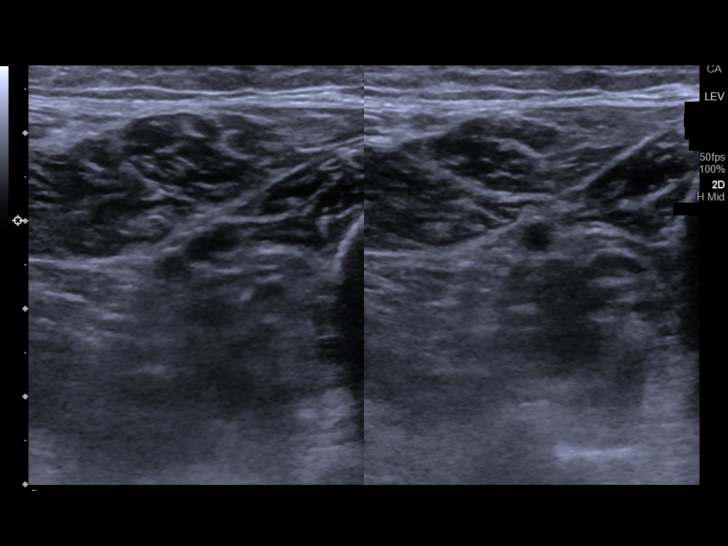
[im 29/32]
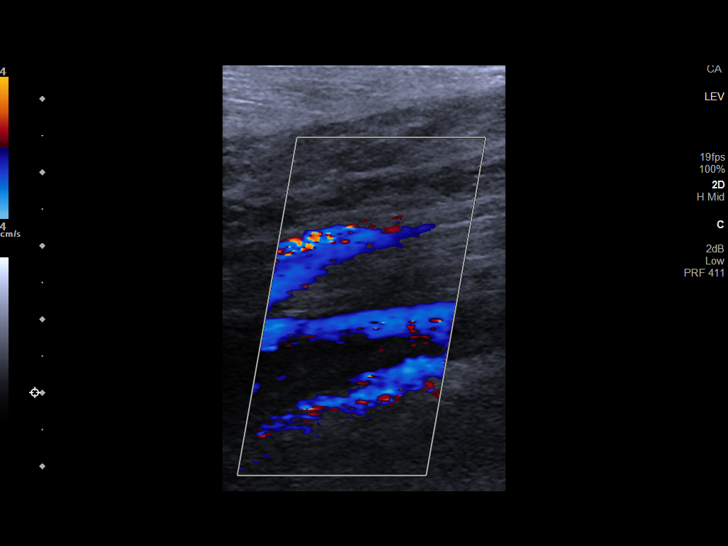
[im 32/32]
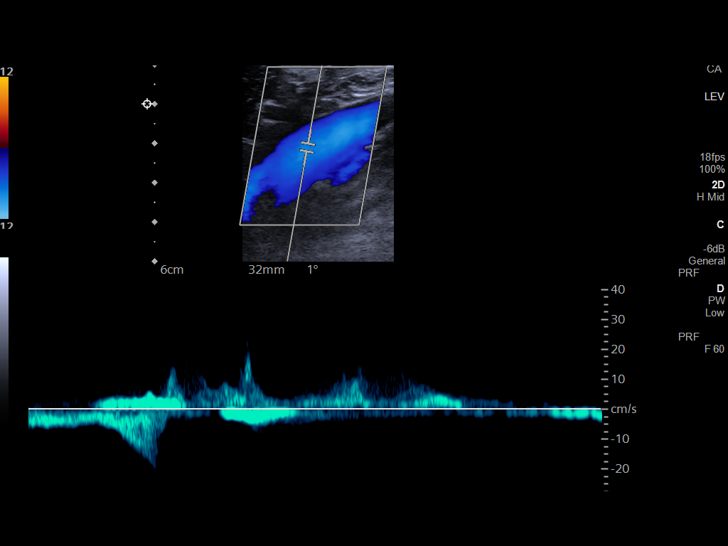

[13 of 24 positions shown; findings below may reference images not displayed]

FINDINGS: Contralateral Common Femoral Vein: Respiratory phasicity is normal
and symmetric with the symptomatic side. No evidence of thrombus.
Normal compressibility.

Common Femoral Vein: No evidence of thrombus. Normal
compressibility, respiratory phasicity and response to augmentation.

Saphenofemoral Junction: No evidence of thrombus. Normal
compressibility and flow on color Doppler imaging.

Profunda Femoral Vein: No evidence of thrombus. Normal
compressibility and flow on color Doppler imaging.

Femoral Vein: No evidence of thrombus. Normal compressibility,
respiratory phasicity and response to augmentation.

Popliteal Vein: No evidence of thrombus. Normal compressibility,
respiratory phasicity and response to augmentation.

Calf Veins: No evidence of thrombus. Normal compressibility and flow
on color Doppler imaging.

Other Findings: Note is made of a known occluded native left
popliteal artery. Patient apparently has a bypass graft.
IMPRESSION: No evidence of deep venous thrombosis.

## 2022-07-25 ENCOUNTER — Other Ambulatory Visit: Payer: Self-pay

## 2022-08-03 ENCOUNTER — Other Ambulatory Visit: Payer: Self-pay

## 2022-08-03 DIAGNOSIS — Z96652 Presence of left artificial knee joint: Secondary | ICD-10-CM | POA: Diagnosis not present

## 2022-08-03 DIAGNOSIS — M1712 Unilateral primary osteoarthritis, left knee: Secondary | ICD-10-CM | POA: Diagnosis not present

## 2022-08-03 MED ORDER — METHYLPREDNISOLONE 4 MG PO TBPK
ORAL_TABLET | ORAL | 0 refills | Status: DC
Start: 2022-08-03 — End: 2022-10-05
  Filled 2022-08-03: qty 21, 6d supply, fill #0

## 2022-08-03 MED ORDER — GABAPENTIN 400 MG PO CAPS
ORAL_CAPSULE | ORAL | 11 refills | Status: AC
  Filled 2022-08-03: qty 90, 30d supply, fill #0
  Filled 2022-09-06: qty 90, 30d supply, fill #1

## 2022-08-08 ENCOUNTER — Other Ambulatory Visit: Payer: Self-pay

## 2022-08-08 MED ORDER — AMLODIPINE BESYLATE 10 MG PO TABS
ORAL_TABLET | Freq: Every day | ORAL | 3 refills | Status: AC
Start: 2022-08-08 — End: 2023-08-08
  Filled 2022-08-08: qty 90, 90d supply, fill #0
  Filled 2022-11-07: qty 30, 30d supply, fill #1
  Filled 2022-12-05 (×2): qty 30, 30d supply, fill #2
  Filled 2022-12-17: qty 30, 30d supply, fill #3

## 2022-08-14 ENCOUNTER — Other Ambulatory Visit: Payer: Self-pay

## 2022-08-15 ENCOUNTER — Other Ambulatory Visit: Payer: Self-pay

## 2022-08-21 ENCOUNTER — Other Ambulatory Visit: Payer: Self-pay

## 2022-08-22 ENCOUNTER — Other Ambulatory Visit: Payer: Self-pay

## 2022-08-23 ENCOUNTER — Other Ambulatory Visit: Payer: Self-pay

## 2022-08-23 MED ORDER — LEVOCETIRIZINE DIHYDROCHLORIDE 5 MG PO TABS
ORAL_TABLET | Freq: Every day | ORAL | 3 refills | Status: AC
  Filled 2022-08-23: qty 90, 90d supply, fill #0
  Filled 2022-11-23: qty 90, 90d supply, fill #1

## 2022-08-24 ENCOUNTER — Other Ambulatory Visit: Payer: Self-pay

## 2022-08-27 ENCOUNTER — Other Ambulatory Visit: Payer: Self-pay

## 2022-08-27 DIAGNOSIS — Z96652 Presence of left artificial knee joint: Secondary | ICD-10-CM | POA: Diagnosis not present

## 2022-08-27 DIAGNOSIS — M1712 Unilateral primary osteoarthritis, left knee: Secondary | ICD-10-CM | POA: Diagnosis not present

## 2022-08-27 MED ORDER — MELOXICAM 15 MG PO TABS
ORAL_TABLET | ORAL | 3 refills | Status: AC
  Filled 2022-08-27: qty 90, 90d supply, fill #0

## 2022-09-06 ENCOUNTER — Other Ambulatory Visit: Payer: Self-pay

## 2022-09-17 ENCOUNTER — Other Ambulatory Visit: Payer: Self-pay

## 2022-09-17 MED ORDER — LORAZEPAM 1 MG PO TABS
0.5000 mg | ORAL_TABLET | Freq: Two times a day (BID) | ORAL | 4 refills | Status: AC
  Filled 2022-09-17: qty 30, 30d supply, fill #0
  Filled 2022-10-16: qty 30, 30d supply, fill #1
  Filled 2022-12-17: qty 30, 30d supply, fill #2

## 2022-09-27 ENCOUNTER — Other Ambulatory Visit: Payer: Self-pay

## 2022-10-05 ENCOUNTER — Other Ambulatory Visit: Payer: Self-pay

## 2022-10-05 DIAGNOSIS — M25562 Pain in left knee: Secondary | ICD-10-CM | POA: Diagnosis not present

## 2022-10-05 DIAGNOSIS — Z96652 Presence of left artificial knee joint: Secondary | ICD-10-CM | POA: Diagnosis not present

## 2022-10-05 DIAGNOSIS — M25552 Pain in left hip: Secondary | ICD-10-CM | POA: Diagnosis not present

## 2022-10-05 DIAGNOSIS — G8929 Other chronic pain: Secondary | ICD-10-CM | POA: Diagnosis not present

## 2022-10-05 MED ORDER — METHYLPREDNISOLONE 4 MG PO TBPK
ORAL_TABLET | ORAL | 0 refills | Status: AC
Start: 2022-10-05 — End: ?
  Filled 2022-10-05: qty 21, 6d supply, fill #0

## 2022-10-12 DIAGNOSIS — E785 Hyperlipidemia, unspecified: Secondary | ICD-10-CM | POA: Diagnosis not present

## 2022-10-12 DIAGNOSIS — E119 Type 2 diabetes mellitus without complications: Secondary | ICD-10-CM | POA: Diagnosis not present

## 2022-10-12 DIAGNOSIS — Z125 Encounter for screening for malignant neoplasm of prostate: Secondary | ICD-10-CM | POA: Diagnosis not present

## 2022-10-12 DIAGNOSIS — I10 Essential (primary) hypertension: Secondary | ICD-10-CM | POA: Diagnosis not present

## 2022-10-16 ENCOUNTER — Other Ambulatory Visit: Payer: Self-pay

## 2022-10-18 DIAGNOSIS — G8929 Other chronic pain: Secondary | ICD-10-CM | POA: Diagnosis not present

## 2022-10-18 DIAGNOSIS — M1712 Unilateral primary osteoarthritis, left knee: Secondary | ICD-10-CM | POA: Diagnosis not present

## 2022-10-18 DIAGNOSIS — M25562 Pain in left knee: Secondary | ICD-10-CM | POA: Diagnosis not present

## 2022-10-18 DIAGNOSIS — Z96652 Presence of left artificial knee joint: Secondary | ICD-10-CM | POA: Diagnosis not present

## 2022-11-07 ENCOUNTER — Other Ambulatory Visit: Payer: Self-pay

## 2022-11-23 ENCOUNTER — Other Ambulatory Visit: Payer: Self-pay

## 2022-12-05 ENCOUNTER — Other Ambulatory Visit: Payer: Self-pay

## 2022-12-14 ENCOUNTER — Other Ambulatory Visit: Payer: Self-pay

## 2022-12-17 ENCOUNTER — Other Ambulatory Visit: Payer: Self-pay

## 2023-05-17 ENCOUNTER — Other Ambulatory Visit: Payer: Self-pay

## 2023-05-21 ENCOUNTER — Other Ambulatory Visit: Payer: Self-pay

## 2024-01-01 ENCOUNTER — Other Ambulatory Visit: Payer: Self-pay

## 2024-01-01 MED ORDER — MELOXICAM 7.5 MG PO TABS
7.5000 mg | ORAL_TABLET | Freq: Every day | ORAL | 5 refills | Status: AC
  Filled 2024-01-01: qty 30, 30d supply, fill #0

## 2024-01-20 ENCOUNTER — Other Ambulatory Visit: Payer: Self-pay
